# Patient Record
Sex: Female | Born: 1950 | Race: White | Hispanic: No | Marital: Married | Smoking: Current every day smoker
Health system: Southern US, Community
[De-identification: ages and names within clinical notes are randomized; demographics above are authoritative.]

## PROBLEM LIST (undated history)

## (undated) DIAGNOSIS — K219 Gastro-esophageal reflux disease without esophagitis: Secondary | ICD-10-CM

## (undated) DIAGNOSIS — M545 Low back pain, unspecified: Secondary | ICD-10-CM

## (undated) DIAGNOSIS — T4145XA Adverse effect of unspecified anesthetic, initial encounter: Secondary | ICD-10-CM

## (undated) DIAGNOSIS — Z8709 Personal history of other diseases of the respiratory system: Secondary | ICD-10-CM

## (undated) DIAGNOSIS — J189 Pneumonia, unspecified organism: Secondary | ICD-10-CM

## (undated) DIAGNOSIS — T8859XA Other complications of anesthesia, initial encounter: Secondary | ICD-10-CM

## (undated) DIAGNOSIS — R51 Headache: Secondary | ICD-10-CM

## (undated) DIAGNOSIS — R519 Headache, unspecified: Secondary | ICD-10-CM

## (undated) DIAGNOSIS — G8929 Other chronic pain: Secondary | ICD-10-CM

## (undated) DIAGNOSIS — Z8489 Family history of other specified conditions: Secondary | ICD-10-CM

## (undated) DIAGNOSIS — G43909 Migraine, unspecified, not intractable, without status migrainosus: Secondary | ICD-10-CM

## (undated) DIAGNOSIS — M1990 Unspecified osteoarthritis, unspecified site: Secondary | ICD-10-CM

---

## 1969-07-08 HISTORY — PX: WISDOM TOOTH EXTRACTION: SHX21

## 1984-11-07 HISTORY — PX: TUBAL LIGATION: SHX77

## 2012-06-27 HISTORY — PX: FOOT SURGERY: SHX648

## 2012-07-23 ENCOUNTER — Other Ambulatory Visit: Payer: Self-pay | Admitting: Neurological Surgery

## 2012-08-01 ENCOUNTER — Encounter (HOSPITAL_COMMUNITY): Payer: Self-pay | Admitting: Pharmacy Technician

## 2012-08-06 ENCOUNTER — Encounter (HOSPITAL_COMMUNITY): Payer: Self-pay

## 2012-08-06 ENCOUNTER — Encounter (HOSPITAL_COMMUNITY)
Admission: RE | Admit: 2012-08-06 | Discharge: 2012-08-06 | Disposition: A | Discharge status: Home / Self Care | Payer: 59 | Source: Ambulatory Visit | Attending: Neurological Surgery | Admitting: Neurological Surgery

## 2012-08-06 HISTORY — DX: Unspecified osteoarthritis, unspecified site: M19.90

## 2012-08-06 HISTORY — DX: Family history of other specified conditions: Z84.89

## 2012-08-06 HISTORY — DX: Gastro-esophageal reflux disease without esophagitis: K21.9

## 2012-08-06 LAB — BASIC METABOLIC PANEL
BUN: 16 mg/dL (ref 6–23)
Chloride: 105 mEq/L (ref 96–112)
Glucose, Bld: 98 mg/dL (ref 70–99)
Potassium: 4.1 mEq/L (ref 3.5–5.1)

## 2012-08-06 LAB — CBC WITH DIFFERENTIAL/PLATELET
Basophils Relative: 1 % (ref 0–1)
Eosinophils Absolute: 0.2 10*3/uL (ref 0.0–0.7)
Eosinophils Relative: 2 % (ref 0–5)
HCT: 42.8 % (ref 36.0–46.0)
Hemoglobin: 14.9 g/dL (ref 12.0–15.0)
MCH: 32.7 pg (ref 26.0–34.0)
MCHC: 34.8 g/dL (ref 30.0–36.0)
MCV: 93.9 fL (ref 78.0–100.0)
Monocytes Absolute: 0.6 10*3/uL (ref 0.1–1.0)
Monocytes Relative: 5 % (ref 3–12)

## 2012-08-06 LAB — ABO/RH: ABO/RH(D): A POS

## 2012-08-06 NOTE — Pre-Procedure Instructions (Signed)
20 [name] [facility]  08/06/2012   Your procedure is scheduled on:  Wednesday October 9  Report to [facility] at 6:30 AM.  Call this number if you have problems the morning of surgery: [phone]   Remember:   Do not eat or drink:After Midnight.    Take these medicines the morning of surgery with A SIP OF WATER: Tylenol 3, valtrex, protonix if needed.    Do not wear jewelry, make-up or nail polish.  Do not wear lotions, powders, or perfumes. You may wear deodorant.  Do not shave 48 hours prior to surgery. Men may shave face and neck.  Do not bring valuables to the hospital.  Contacts, dentures or bridgework may not be worn into surgery.  Leave suitcase in the car. After surgery it may be brought to your room.  For patients admitted to the hospital, checkout time is 11:00 AM the day of discharge.   Patients discharged the day of surgery will not be allowed to drive home.  Name and phone number of your driver: NA  Special Instructions: Shower using CHG 2 nights before surgery and the night before surgery.  If you shower the day of surgery use CHG.  Use special wash - you have one bottle of CHG for all showers.  You should use approximately 1/3 of the bottle for each shower.   Please read over the following fact sheets that you were given: Pain Booklet, Coughing and Deep Breathing, Blood Transfusion Information and Surgical Site Infection Prevention

## 2012-08-06 NOTE — Progress Notes (Addendum)
Requested CXR from [facility] in [place] - Dr. Emilie Rutter

## 2012-08-14 MED ORDER — DEXAMETHASONE SODIUM PHOSPHATE 10 MG/ML IJ SOLN
10.0000 mg | INTRAMUSCULAR | Status: AC
  Administered 2012-08-15: 10 mg via INTRAVENOUS
  Filled 2012-08-14: qty 1

## 2012-08-14 MED ORDER — VANCOMYCIN HCL 1000 MG IV SOLR
1500.0000 mg | INTRAVENOUS | Status: AC
  Administered 2012-08-15: 1500 mg via INTRAVENOUS
  Filled 2012-08-14: qty 1500

## 2012-08-15 ENCOUNTER — Ambulatory Visit (HOSPITAL_COMMUNITY): Payer: 59

## 2012-08-15 ENCOUNTER — Encounter (HOSPITAL_COMMUNITY)
Admission: RE | Disposition: A | Discharge status: Home / Self Care | Payer: Self-pay | Source: Ambulatory Visit | Attending: Neurological Surgery

## 2012-08-15 ENCOUNTER — Encounter (HOSPITAL_COMMUNITY): Payer: Self-pay | Admitting: Anesthesiology

## 2012-08-15 ENCOUNTER — Inpatient Hospital Stay (HOSPITAL_COMMUNITY)
Admission: RE | Admit: 2012-08-15 | Discharge: 2012-08-16 | DRG: 460 | Disposition: A | Discharge status: Home / Self Care | Payer: 59 | Source: Ambulatory Visit | Attending: Neurological Surgery | Admitting: Neurological Surgery

## 2012-08-15 ENCOUNTER — Encounter (HOSPITAL_COMMUNITY): Payer: Self-pay | Admitting: Neurological Surgery

## 2012-08-15 ENCOUNTER — Ambulatory Visit (HOSPITAL_COMMUNITY): Payer: 59 | Admitting: Anesthesiology

## 2012-08-15 ENCOUNTER — Encounter (HOSPITAL_COMMUNITY): Payer: Self-pay | Admitting: Surgery

## 2012-08-15 DIAGNOSIS — M47817 Spondylosis without myelopathy or radiculopathy, lumbosacral region: Principal | ICD-10-CM | POA: Diagnosis present

## 2012-08-15 DIAGNOSIS — Z882 Allergy status to sulfonamides status: Secondary | ICD-10-CM

## 2012-08-15 DIAGNOSIS — Z7982 Long term (current) use of aspirin: Secondary | ICD-10-CM

## 2012-08-15 DIAGNOSIS — Z981 Arthrodesis status: Secondary | ICD-10-CM

## 2012-08-15 DIAGNOSIS — Z881 Allergy status to other antibiotic agents status: Secondary | ICD-10-CM

## 2012-08-15 DIAGNOSIS — Z880 Allergy status to penicillin: Secondary | ICD-10-CM

## 2012-08-15 DIAGNOSIS — K219 Gastro-esophageal reflux disease without esophagitis: Secondary | ICD-10-CM | POA: Diagnosis present

## 2012-08-15 DIAGNOSIS — F17200 Nicotine dependence, unspecified, uncomplicated: Secondary | ICD-10-CM | POA: Diagnosis present

## 2012-08-15 DIAGNOSIS — Z888 Allergy status to other drugs, medicaments and biological substances status: Secondary | ICD-10-CM

## 2012-08-15 DIAGNOSIS — Q762 Congenital spondylolisthesis: Secondary | ICD-10-CM

## 2012-08-15 HISTORY — DX: Personal history of other diseases of the respiratory system: Z87.09

## 2012-08-15 HISTORY — DX: Other complications of anesthesia, initial encounter: T88.59XA

## 2012-08-15 HISTORY — DX: Headache, unspecified: R51.9

## 2012-08-15 HISTORY — DX: Low back pain: M54.5

## 2012-08-15 HISTORY — DX: Adverse effect of unspecified anesthetic, initial encounter: T41.45XA

## 2012-08-15 HISTORY — DX: Pneumonia, unspecified organism: J18.9

## 2012-08-15 HISTORY — DX: Low back pain, unspecified: M54.50

## 2012-08-15 HISTORY — DX: Migraine, unspecified, not intractable, without status migrainosus: G43.909

## 2012-08-15 HISTORY — DX: Headache: R51

## 2012-08-15 HISTORY — DX: Other chronic pain: G89.29

## 2012-08-15 HISTORY — PX: POSTERIOR FUSION LUMBAR SPINE: SUR632

## 2012-08-15 SURGERY — POSTERIOR LUMBAR FUSION 1 LEVEL
Anesthesia: General | Site: Back | Wound class: Clean

## 2012-08-15 MED ORDER — PHENYLEPHRINE HCL 10 MG/ML IJ SOLN
10.0000 mg | INTRAVENOUS | Status: DC | PRN
  Administered 2012-08-15: 10 ug/min via INTRAVENOUS

## 2012-08-15 MED ORDER — PROPOFOL INFUSION 10 MG/ML OPTIME
INTRAVENOUS | Status: DC | PRN
  Administered 2012-08-15: 50 ug/kg/min via INTRAVENOUS

## 2012-08-15 MED ORDER — PHENYLEPHRINE HCL 10 MG/ML IJ SOLN
INTRAMUSCULAR | Status: DC | PRN
  Administered 2012-08-15: 40 ug via INTRAVENOUS
  Administered 2012-08-15 (×2): 80 ug via INTRAVENOUS

## 2012-08-15 MED ORDER — ACETAMINOPHEN 325 MG PO TABS: 650.0000 mg | ORAL_TABLET | ORAL | Status: DC | PRN

## 2012-08-15 MED ORDER — OXYCODONE-ACETAMINOPHEN 5-325 MG PO TABS
1.0000 | ORAL_TABLET | ORAL | Status: DC | PRN
  Administered 2012-08-15 – 2012-08-16 (×3): 2 via ORAL
  Filled 2012-08-15 (×3): qty 2

## 2012-08-15 MED ORDER — PROMETHAZINE HCL 25 MG/ML IJ SOLN: 6.2500 mg | INTRAMUSCULAR | Status: DC | PRN

## 2012-08-15 MED ORDER — HEPARIN SODIUM (PORCINE) 1000 UNIT/ML IJ SOLN
INTRAMUSCULAR | Status: AC
  Filled 2012-08-15: qty 1

## 2012-08-15 MED ORDER — ADULT MULTIVITAMIN W/MINERALS CH
1.0000 | ORAL_TABLET | Freq: Every day | ORAL | Status: DC
  Administered 2012-08-15: 1 via ORAL
  Filled 2012-08-15 (×2): qty 1

## 2012-08-15 MED ORDER — ONDANSETRON HCL 4 MG/2ML IJ SOLN
INTRAMUSCULAR | Status: DC | PRN
  Administered 2012-08-15: 4 mg via INTRAVENOUS

## 2012-08-15 MED ORDER — ONDANSETRON HCL 4 MG/2ML IJ SOLN: 4.0000 mg | INTRAMUSCULAR | Status: DC | PRN

## 2012-08-15 MED ORDER — MENTHOL 3 MG MT LOZG: 1.0000 | LOZENGE | OROMUCOSAL | Status: DC | PRN

## 2012-08-15 MED ORDER — SODIUM CHLORIDE 0.9 % IV SOLN
INTRAVENOUS | Status: AC
  Filled 2012-08-15: qty 500

## 2012-08-15 MED ORDER — PHENOL 1.4 % MT LIQD
1.0000 | OROMUCOSAL | Status: DC | PRN
  Filled 2012-08-15: qty 177

## 2012-08-15 MED ORDER — ASPIRIN 81 MG PO CHEW
81.0000 mg | CHEWABLE_TABLET | Freq: Every day | ORAL | Status: DC
  Administered 2012-08-15: 81 mg via ORAL
  Filled 2012-08-15 (×2): qty 1

## 2012-08-15 MED ORDER — ACETAMINOPHEN 650 MG RE SUPP: 650.0000 mg | RECTAL | Status: DC | PRN

## 2012-08-15 MED ORDER — 0.9 % SODIUM CHLORIDE (POUR BTL) OPTIME
TOPICAL | Status: DC | PRN
  Administered 2012-08-15: 1000 mL

## 2012-08-15 MED ORDER — HYDROMORPHONE HCL PF 1 MG/ML IJ SOLN
0.2500 mg | INTRAMUSCULAR | Status: DC | PRN
  Administered 2012-08-15 (×4): 0.5 mg via INTRAVENOUS

## 2012-08-15 MED ORDER — VANCOMYCIN HCL IN DEXTROSE 1-5 GM/200ML-% IV SOLN
1000.0000 mg | Freq: Two times a day (BID) | INTRAVENOUS | Status: AC
  Administered 2012-08-15: 1000 mg via INTRAVENOUS
  Filled 2012-08-15: qty 200

## 2012-08-15 MED ORDER — CYCLOBENZAPRINE HCL 10 MG PO TABS: 10.0000 mg | ORAL_TABLET | Freq: Three times a day (TID) | ORAL | Status: DC | PRN

## 2012-08-15 MED ORDER — MEPERIDINE HCL 25 MG/ML IJ SOLN: 6.2500 mg | INTRAMUSCULAR | Status: DC | PRN

## 2012-08-15 MED ORDER — LORATADINE 10 MG PO TABS
10.0000 mg | ORAL_TABLET | Freq: Every day | ORAL | Status: DC
  Filled 2012-08-15 (×2): qty 1

## 2012-08-15 MED ORDER — HEMOSTATIC AGENTS (NO CHARGE) OPTIME
TOPICAL | Status: DC | PRN
  Administered 2012-08-15: 1 via TOPICAL

## 2012-08-15 MED ORDER — ACETAMINOPHEN 10 MG/ML IV SOLN
1000.0000 mg | Freq: Four times a day (QID) | INTRAVENOUS | Status: DC
  Administered 2012-08-15 – 2012-08-16 (×2): 1000 mg via INTRAVENOUS
  Filled 2012-08-15 (×4): qty 100

## 2012-08-15 MED ORDER — MIDAZOLAM HCL 5 MG/5ML IJ SOLN
INTRAMUSCULAR | Status: DC | PRN
  Administered 2012-08-15: 2 mg via INTRAVENOUS

## 2012-08-15 MED ORDER — LIDOCAINE HCL (CARDIAC) 20 MG/ML IV SOLN
INTRAVENOUS | Status: DC | PRN
  Administered 2012-08-15: 50 mg via INTRAVENOUS

## 2012-08-15 MED ORDER — PANTOPRAZOLE SODIUM 40 MG PO TBEC
40.0000 mg | DELAYED_RELEASE_TABLET | Freq: Every day | ORAL | Status: DC
  Administered 2012-08-15: 40 mg via ORAL
  Filled 2012-08-15: qty 1

## 2012-08-15 MED ORDER — SENNA 8.6 MG PO TABS
1.0000 | ORAL_TABLET | Freq: Two times a day (BID) | ORAL | Status: DC
  Filled 2012-08-15 (×3): qty 1

## 2012-08-15 MED ORDER — OXYCODONE HCL 5 MG PO TABS
5.0000 mg | ORAL_TABLET | Freq: Once | ORAL | Status: AC | PRN
  Administered 2012-08-15: 5 mg via ORAL

## 2012-08-15 MED ORDER — SUFENTANIL CITRATE 50 MCG/ML IV SOLN
INTRAVENOUS | Status: DC | PRN
  Administered 2012-08-15: 20 ug via INTRAVENOUS
  Administered 2012-08-15: 10 ug via INTRAVENOUS
  Administered 2012-08-15: 20 ug via INTRAVENOUS

## 2012-08-15 MED ORDER — HYDROMORPHONE HCL PF 1 MG/ML IJ SOLN
INTRAMUSCULAR | Status: AC
  Filled 2012-08-15: qty 1

## 2012-08-15 MED ORDER — PROPOFOL 10 MG/ML IV BOLUS
INTRAVENOUS | Status: DC | PRN
  Administered 2012-08-15: 25 mg via INTRAVENOUS
  Administered 2012-08-15: 150 mg via INTRAVENOUS

## 2012-08-15 MED ORDER — SUCCINYLCHOLINE CHLORIDE 20 MG/ML IJ SOLN
INTRAMUSCULAR | Status: DC | PRN
  Administered 2012-08-15: 100 mg via INTRAVENOUS

## 2012-08-15 MED ORDER — BACITRACIN 50000 UNITS IM SOLR
INTRAMUSCULAR | Status: AC
  Filled 2012-08-15: qty 1

## 2012-08-15 MED ORDER — SODIUM CHLORIDE 0.9 % IJ SOLN
3.0000 mL | Freq: Two times a day (BID) | INTRAMUSCULAR | Status: DC
  Administered 2012-08-15: 3 mL via INTRAVENOUS

## 2012-08-15 MED ORDER — INFLUENZA VIRUS VACC SPLIT PF IM SUSP
0.5000 mL | INTRAMUSCULAR | Status: DC
  Filled 2012-08-15: qty 0.5

## 2012-08-15 MED ORDER — BUPIVACAINE HCL (PF) 0.25 % IJ SOLN
INTRAMUSCULAR | Status: DC | PRN
  Administered 2012-08-15: 3 mL

## 2012-08-15 MED ORDER — OXYCODONE HCL 5 MG PO TABS
ORAL_TABLET | ORAL | Status: AC
  Filled 2012-08-15: qty 1

## 2012-08-15 MED ORDER — SODIUM CHLORIDE 0.9 % IJ SOLN: 3.0000 mL | INTRAMUSCULAR | Status: DC | PRN

## 2012-08-15 MED ORDER — ASPIRIN 81 MG PO TABS: 81.0000 mg | ORAL_TABLET | Freq: Every day | ORAL | Status: DC

## 2012-08-15 MED ORDER — THROMBIN 20000 UNITS EX SOLR
CUTANEOUS | Status: DC | PRN
  Administered 2012-08-15: 08:00:00 via TOPICAL

## 2012-08-15 MED ORDER — ZOLPIDEM TARTRATE 5 MG PO TABS: 5.0000 mg | ORAL_TABLET | Freq: Every evening | ORAL | Status: DC | PRN

## 2012-08-15 MED ORDER — OXYCODONE HCL 5 MG/5ML PO SOLN: 5.0000 mg | Freq: Once | ORAL | Status: AC | PRN

## 2012-08-15 MED ORDER — POTASSIUM CHLORIDE IN NACL 20-0.9 MEQ/L-% IV SOLN
INTRAVENOUS | Status: DC
  Administered 2012-08-15: 17:00:00 via INTRAVENOUS
  Filled 2012-08-15 (×3): qty 1000

## 2012-08-15 MED ORDER — DEXAMETHASONE 4 MG PO TABS
4.0000 mg | ORAL_TABLET | Freq: Four times a day (QID) | ORAL | Status: DC
  Administered 2012-08-15: 4 mg via ORAL
  Filled 2012-08-15 (×7): qty 1

## 2012-08-15 MED ORDER — NICOTINE 7 MG/24HR TD PT24
7.0000 mg | MEDICATED_PATCH | Freq: Every day | TRANSDERMAL | Status: DC
  Administered 2012-08-15: 7 mg via TRANSDERMAL
  Filled 2012-08-15 (×3): qty 1

## 2012-08-15 MED ORDER — DEXAMETHASONE SODIUM PHOSPHATE 4 MG/ML IJ SOLN
4.0000 mg | Freq: Four times a day (QID) | INTRAMUSCULAR | Status: DC
  Administered 2012-08-16 (×2): 4 mg via INTRAVENOUS
  Filled 2012-08-15 (×6): qty 1

## 2012-08-15 MED ORDER — LACTATED RINGERS IV SOLN
INTRAVENOUS | Status: DC | PRN
  Administered 2012-08-15 (×2): via INTRAVENOUS

## 2012-08-15 MED ORDER — MORPHINE SULFATE 2 MG/ML IJ SOLN
1.0000 mg | INTRAMUSCULAR | Status: DC | PRN
  Administered 2012-08-15 (×2): 2 mg via INTRAVENOUS
  Filled 2012-08-15 (×2): qty 1

## 2012-08-15 SURGICAL SUPPLY — 61 items
BAG DECANTER FOR FLEXI CONT (MISCELLANEOUS) ×2 IMPLANT
BENZOIN TINCTURE PRP APPL 2/3 (GAUZE/BANDAGES/DRESSINGS) ×2 IMPLANT
BLADE SURG ROTATE 9660 (MISCELLANEOUS) IMPLANT
BUR MATCHSTICK NEURO 3.0 LAGG (BURR) ×2 IMPLANT
CAGE COROENT 12X9X23-8 (Cage) ×4 IMPLANT
CANISTER SUCTION 2500CC (MISCELLANEOUS) ×2 IMPLANT
CLIP NEUROVISION LG (CLIP) ×2 IMPLANT
CLOTH BEACON ORANGE TIMEOUT ST (SAFETY) ×2 IMPLANT
CONT SPEC 4OZ CLIKSEAL STRL BL (MISCELLANEOUS) ×4 IMPLANT
COVER BACK TABLE 24X17X13 BIG (DRAPES) IMPLANT
COVER TABLE BACK 60X90 (DRAPES) ×2 IMPLANT
DRAPE C-ARM 42X72 X-RAY (DRAPES) ×4 IMPLANT
DRAPE LAPAROTOMY 100X72X124 (DRAPES) ×2 IMPLANT
DRAPE POUCH INSTRU U-SHP 10X18 (DRAPES) ×2 IMPLANT
DRAPE SURG 17X23 STRL (DRAPES) ×2 IMPLANT
DRESSING TELFA 8X3 (GAUZE/BANDAGES/DRESSINGS) ×2 IMPLANT
DRSG OPSITE 4X5.5 SM (GAUZE/BANDAGES/DRESSINGS) ×2 IMPLANT
DURAPREP 26ML APPLICATOR (WOUND CARE) ×2 IMPLANT
ELECT REM PT RETURN 9FT ADLT (ELECTROSURGICAL) ×2
ELECTRODE REM PT RTRN 9FT ADLT (ELECTROSURGICAL) ×1 IMPLANT
EVACUATOR 1/8 PVC DRAIN (DRAIN) ×2 IMPLANT
GAUZE SPONGE 4X4 16PLY XRAY LF (GAUZE/BANDAGES/DRESSINGS) IMPLANT
GLOVE BIO SURGEON STRL SZ8 (GLOVE) ×4 IMPLANT
GLOVE BIOGEL PI IND STRL 6.5 (GLOVE) ×2 IMPLANT
GLOVE BIOGEL PI INDICATOR 6.5 (GLOVE) ×2
GLOVE INDICATOR 7.0 STRL GRN (GLOVE) ×2 IMPLANT
GOWN BRE IMP SLV AUR LG STRL (GOWN DISPOSABLE) IMPLANT
GOWN BRE IMP SLV AUR XL STRL (GOWN DISPOSABLE) ×4 IMPLANT
GOWN STRL REIN 2XL LVL4 (GOWN DISPOSABLE) IMPLANT
HEMOSTAT POWDER KIT SURGIFOAM (HEMOSTASIS) IMPLANT
KIT BASIN OR (CUSTOM PROCEDURE TRAY) ×2 IMPLANT
KIT NEEDLE NVM5 EMG ELECT (KITS) ×1 IMPLANT
KIT NEEDLE NVM5 EMG ELECTRODE (KITS) ×1
KIT ROOM TURNOVER OR (KITS) ×2 IMPLANT
LIGHT SOURCE ANGLE TIP STR 7FT (MISCELLANEOUS) ×2 IMPLANT
MILL MEDIUM DISP (BLADE) IMPLANT
MIX DBX 10CC 35% BONE (Bone Implant) ×2 IMPLANT
NEEDLE HYPO 25X1 1.5 SAFETY (NEEDLE) ×2 IMPLANT
NS IRRIG 1000ML POUR BTL (IV SOLUTION) ×2 IMPLANT
PACK LAMINECTOMY NEURO (CUSTOM PROCEDURE TRAY) ×2 IMPLANT
PAD ARMBOARD 7.5X6 YLW CONV (MISCELLANEOUS) ×6 IMPLANT
ROD 35MM (Rod) ×2 IMPLANT
ROD 5.5X40MM (Rod) ×2 IMPLANT
SCREW LOCK (Screw) ×4 IMPLANT
SCREW LOCK FXNS SPNE MAS PL (Screw) ×4 IMPLANT
SCREW SHANK 5.0X35 (Screw) ×4 IMPLANT
SCREW SHANK 6.5X65 (Screw) ×4 IMPLANT
SCREW TULIP 5.5 (Screw) ×8 IMPLANT
SPONGE LAP 4X18 X RAY DECT (DISPOSABLE) IMPLANT
SPONGE SURGIFOAM ABS GEL 100 (HEMOSTASIS) ×2 IMPLANT
STRIP CLOSURE SKIN 1/2X4 (GAUZE/BANDAGES/DRESSINGS) ×2 IMPLANT
SUT VIC AB 0 CT1 18XCR BRD8 (SUTURE) ×1 IMPLANT
SUT VIC AB 0 CT1 8-18 (SUTURE) ×1
SUT VIC AB 2-0 CP2 18 (SUTURE) ×2 IMPLANT
SUT VIC AB 3-0 SH 8-18 (SUTURE) ×4 IMPLANT
SYR 20ML ECCENTRIC (SYRINGE) ×2 IMPLANT
TOWEL OR 17X24 6PK STRL BLUE (TOWEL DISPOSABLE) ×2 IMPLANT
TOWEL OR 17X26 10 PK STRL BLUE (TOWEL DISPOSABLE) ×2 IMPLANT
TRAP SPECIMEN MUCOUS 40CC (MISCELLANEOUS) IMPLANT
TRAY FOLEY CATH 14FRSI W/METER (CATHETERS) ×2 IMPLANT
WATER STERILE IRR 1000ML POUR (IV SOLUTION) ×2 IMPLANT

## 2012-08-15 NOTE — Transfer of Care (Signed)
Immediate Anesthesia Transfer of Care Note  Patient: Lori Howard  Procedure(s) Performed: Procedure(s) (LRB) with comments: POSTERIOR LUMBAR FUSION 1 LEVEL (N/A) - POSTERIOR LUMBAR FIVE SACRAL ONE FUSION NUVASIVE MONITORING  Patient Location: PACU  Anesthesia Type: General  Level of Consciousness: awake, alert  and oriented  Airway & Oxygen Therapy: Patient Spontanous Breathing and Patient connected to face mask oxygen  Post-op Assessment: Report given to PACU RN, Post -op Vital signs reviewed and stable and Patient moving all extremities  Post vital signs: Reviewed and stable  Complications: No apparent anesthesia complications

## 2012-08-15 NOTE — Progress Notes (Signed)
Pharmacy consult note for vancomycin Indication: surgical prophylaxis post op  Weight - 103kg    Scr 0.77 Patient received 1.5g IV at 0830 this morning.  Plan:  Will give vancomycin 1g x 1 dose this evening  Pharmacy will sign off.  Please reconsult if needed.  Celedonio Miyamoto, PharmD, BCPS Clinical Pharmacist Pager [phone]

## 2012-08-15 NOTE — OR Nursing (Signed)
1213 nUVASIVE MONITORING NEEDLES REMOVED BY AMY WALSH, RN.

## 2012-08-15 NOTE — Anesthesia Procedure Notes (Signed)
Procedure Name: Intubation Date/Time: 08/15/2012 9:13 AM Performed by: Gwenyth Allegra Pre-anesthesia Checklist: Patient identified, Timeout performed, Emergency Drugs available, Suction available and Patient being monitored Patient Re-evaluated:Patient Re-evaluated prior to inductionOxygen Delivery Method: Circle system utilized Preoxygenation: Pre-oxygenation with 100% oxygen Intubation Type: IV induction Ventilation: Mask ventilation without difficulty and Oral airway inserted - appropriate to patient size Laryngoscope Size: Mac and 3 Grade View: Grade II Tube type: Oral Tube size: 7.5 mm Airway Equipment and Method: Stylet Placement Confirmation: ETT inserted through vocal cords under direct vision,  breath sounds checked- equal and bilateral and positive ETCO2 Secured at: 21 cm Tube secured with: Tape Dental Injury: Teeth and Oropharynx as per pre-operative assessment

## 2012-08-15 NOTE — Op Note (Signed)
08/15/2012  12:19 PM  PATIENT:  [name]  61 y.o. female  PRE-OPERATIVE DIAGNOSIS:  Lumbar spondylosis with spondylolisthesis L5-S1 and back and leg pain   POST-OPERATIVE DIAGNOSIS:  Same  PROCEDURE:   1. Decompressive lumbar laminectomy L5-S1 requiring more work than would be required of the typical PLIF procedure in order to adequately decompress the neural elements.  2. Posterior lumbar interbody fusion L5-S1 using a PEEK interbody cage packed with morcellized allograft and autograft.  3. Posterior fixation L5-S1 using NuVasive cortical pedicle screws.  4. posterior lateral arthrodesis L5-S1 utilizing local autograft and morcellized allograft  SURGEON:  Marikay Alar, MD  ASSISTANTS: Dr. Franky Macho  ANESTHESIA:  General  EBL: Less than 200 ml  Total I/O In: 1000 [I.V.:1000] Out: 300 [Urine:100; Blood:200]  BLOOD ADMINISTERED:none  DRAINS: Hemovac   INDICATION FOR PROCEDURE: This patient presented with severe back pain radiating into the right lower extremity. MRI and plain films showed grade 1 spondylolisthesis L5-S1 with foraminal stenosis and severe facet arthropathy. She tried medical management without relief. Recommended a decompression and instrumented fusion L5-S1. Patient understood the risks, benefits, and alternatives and potential outcomes and wished to proceed.  PROCEDURE DETAILS:  The patient was brought to the operating room. After induction of generalized endotracheal anesthesia the patient was rolled into the prone position on chest rolls and all pressure points were padded. The patient's lumbar region was cleaned and then prepped with DuraPrep and draped in the usual sterile fashion. Anesthesia was injected and then a dorsal midline incision was made and carried down to the lumbosacral fascia. The fascia was opened and the paraspinous musculature was taken down in a subperiosteal fashion to expose L5-S1. Intraoperative EMG monitoring was used  throughout the case. Intraoperative fluoroscopy confirmed my level, and I started by placing the L5 cortical pedicle screws. I used AP and lateral fluoroscopy to identify my entry point as well as surface landmarks 2-3 mm medial to the lateral part of the pars. I used the hand drill followed by the tap to prepare the pedicles bilaterally were acceptance of the pedicle screw. I drilled in a slightly upward and outward direction utilizing AP and lateral fluoroscopy and EMG monitoring. The cortical pedicle screws were placed at L5 and then I turned my attention to the PLIF. The spinous process was removed and complete lumbar laminectomies, hemi laminectomies, and foraminotomies were performed at L5-S1. The yellow ligament was removed to expose the underlying dura and nerve roots, and generous foraminotomies were performed to adequately decompress the neural elements. Once the decompression was complete, I turned my attention to the posterior lower lumbar interbody fusion. The epidural venous vasculature was coagulated and cut sharply. Disc space was incised and the initial discectomy was performed with pituitary rongeurs. The disc space was distracted with sequential distractors to a height of 12 mm. We then used a series of scrapers and shavers to prepare the endplates for fusion. The midline was prepared with Epstein curettes. Once the complete discectomy was finished, we packed an appropriate sized 12 mm peek interbody cage with local autograft and morcellized allograft, gently retracted the nerve root, and tapped the cage into position at L5-S1 bilaterally.. The midline was packed with morselized autograft and allograft. We then turned our attention to the posterior fixation once again. The pedicle screw entry zones of S1 were identified utilizing surface landmarks and fluoroscopy. These the hand drill and the tap once again to prepare for a 6 5 x 35 mm cortical pedicle screw. We  palpated with a ball probe to  assure no break in the cortex. We then placed 6 5 x 35 mm screws into the pedicles bilaterally at S1. We then decorticated the transverse processes and laid a mixture of morcellized autograft and allograft out over these to perform intertransverse arthrodesis at 5 S1. We then placed lordotic rods into the multiaxial screw heads of the pedicle screws and locked these in position with the locking caps and anti-torque device. We achieved compression of our grafts. We then checked our construct with AP and lateral fluoroscopy. Irrigated with copious amounts of bacitracin-containing saline solution. Placed a medium Hemovac drain through separate stab incision. Inspected the nerve roots once again to assure adequate decompression, lined to the dura with Gelfoam, and closed the muscle and the fascia with 0 Vicryl. Closed the subcutaneous tissues with 2-0 Vicryl and subcuticular tissues with 3-0 Vicryl. The skin was closed with benzoin and Steri-Strips. Dressing was then applied, the patient was awakened from general anesthesia and transported to the recovery room in stable condition. At the end of the procedure all sponge, needle and instrument counts were correct.   PLAN OF CARE: Admit to inpatient   PATIENT DISPOSITION:  PACU - hemodynamically stable.   Delay start of Pharmacological VTE agent (>24hrs) due to surgical blood loss or risk of bleeding:  yes

## 2012-08-15 NOTE — Anesthesia Postprocedure Evaluation (Signed)
  Anesthesia Post-op Note  Patient: Lori Howard  Procedure(s) Performed: Procedure(s) (LRB) with comments: POSTERIOR LUMBAR FUSION 1 LEVEL (N/A) - POSTERIOR LUMBAR FIVE SACRAL ONE FUSION NUVASIVE MONITORING  Patient Location: PACU  Anesthesia Type: General  Level of Consciousness: awake  Airway and Oxygen Therapy: Patient Spontanous Breathing  Post-op Pain: mild  Post-op Assessment: Post-op Vital signs reviewed  Post-op Vital Signs: stable  Complications: No apparent anesthesia complications

## 2012-08-15 NOTE — Plan of Care (Signed)
Problem: Consults Goal: Diagnosis - Spinal Surgery Outcome: Completed/Met Date Met:  08/15/12 Thoraco/Lumbar Spine Fusion

## 2012-08-15 NOTE — H&P (Signed)
Subjective: Patient is a 61 y.o. female admitted for PLIF L5-S1. Onset of symptoms was several months ago, gradually worsening since that time.  The pain is rated moderate, and is located at the across the lower back and radiates to RLE. The pain is described as aching and occurs intermittently. The symptoms have been progressive. Symptoms are exacerbated by exercise. MRI or CT showed spondylolisthesis L5-s1.   Past Medical History  Diagnosis Date  . Family history of anesthesia complication     mother coded during surgery - in her 50s  . GERD (gastroesophageal reflux disease)   . Arthritis     Past Surgical History  Procedure Date  . Foot surgery 06/27/2012    bilat - toe joint surgery  . Tubal ligation     Prior to Admission medications   Medication Sig Start Date End Date Taking? Authorizing Provider  acetaminophen-codeine (TYLENOL #3) 300-30 MG per tablet Take 1 tablet by mouth every 6 (six) hours as needed. For pain   Yes Historical Provider, MD  Ascorbic Acid (VITAMIN C) 1000 MG tablet Take 1,000 mg by mouth 2 (two) times daily.   Yes Historical Provider, MD  aspirin 81 MG tablet Take 81 mg by mouth daily.   Yes Historical Provider, MD  Calcium Citrate-Vitamin D (CITRUS CALCIUM/VITAMIN D) 200-250 MG-UNIT TABS Take 2 tablets by mouth 2 (two) times daily.   Yes Historical Provider, MD  cyclobenzaprine (FLEXERIL) 10 MG tablet Take 10 mg by mouth 3 (three) times daily as needed. Muscle spasms   Yes Historical Provider, MD  fexofenadine (ALLEGRA) 30 MG tablet Take 30 mg by mouth 2 (two) times daily.   Yes Historical Provider, MD  fish oil-omega-3 fatty acids 1000 MG capsule Take 1 g by mouth 2 (two) times daily.   Yes Historical Provider, MD  Glucosamine-Chondroit-Vit C-Mn (GLUCOSAMINE CHONDR 1500 COMPLX) CAPS Take by mouth.   Yes Historical Provider, MD  Magnesium 100 MG CAPS Take 100 mg by mouth daily.   Yes Historical Provider, MD  Multiple Vitamin (MULTIVITAMIN WITH MINERALS) TABS  Take 1 tablet by mouth daily.   Yes Historical Provider, MD  Omega-3 Fatty Acids (ULTRA OMEGA-3 FISH OIL) 1400 MG CAPS Take 1,400 mg by mouth 2 (two) times daily.   Yes Historical Provider, MD  pantoprazole (PROTONIX) 40 MG tablet Take 40 mg by mouth daily.   Yes Historical Provider, MD  valACYclovir (VALTREX) 1000 MG tablet Take 1,000 mg by mouth 2 (two) times daily as needed. For  Flare ups    Historical Provider, MD   Allergies  Allergen Reactions  . Lamisil (Terbinafine Hcl) Other (See Comments)    Wheezing & shortness of breath  . Lyrica (Pregabalin) Other (See Comments)    Tunnel vision   . Neosporin (Neomycin-Bacitracin Zn-Polymyx) Other (See Comments)    Swelling, redness, blisters  . Valtrex (Valacyclovir Hcl) Swelling    Facial swelling  . Fluconazole Rash  . Penicillins Itching and Rash  . Sulfa Antibiotics Itching and Rash    History  Substance Use Topics  . Smoking status: Current Every Day Smoker -- 0.5 packs/day  . Smokeless tobacco: Not on file  . Alcohol Use: No    History reviewed. No pertinent family history.   Review of Systems  Positive ROS: neg  All other systems have been reviewed and were otherwise negative with the exception of those mentioned in the HPI and as above.  Objective: Vital signs in last 24 hours: Temp:  [98 F (36.7 C)] 98  F (36.7 C) (10/09 0648) Pulse Rate:  [84] 84  (10/09 0648) Resp:  [17] 17  (10/09 0648) BP: (108)/(73) 108/73 mmHg (10/09 0648) SpO2:  [98 %] 98 % (10/09 0648)  General Appearance: Alert, cooperative, no distress, appears stated age Head: Normocephalic, without obvious abnormality, atraumatic Eyes: PERRL, conjunctiva/corneas clear, EOM's intact, fundi benign, both eyes      Ears: Normal TM's and external ear canals, both ears Throat: Lips, mucosa, and tongue normal; teeth and gums normal Neck: Supple, symmetrical, trachea midline, no adenopathy; thyroid: No enlargement/tenderness/nodules; no carotid bruit or  JVD Back: Symmetric, no curvature, ROM normal, no CVA tenderness Lungs: Clear to auscultation bilaterally, respirations unlabored Heart: Regular rate and rhythm, S1 and S2 normal, no murmur, rub or gallop Abdomen: Soft, non-tender, bowel sounds active all four quadrants, no masses, no organomegaly Extremities: Extremities normal, atraumatic, no cyanosis or edema Pulses: 2+ and symmetric all extremities Skin: Skin color, texture, turgor normal, no rashes or lesions  NEUROLOGIC:   Mental status: Alert and oriented x4,  no aphasia, good attention span, fund of knowledge, and memory Motor Exam - grossly normal Sensory Exam - grossly normal Reflexes: 1+ Coordination - grossly normal Gait - grossly normal Balance - grossly normal Cranial Nerves: I: smell Not tested  II: visual acuity  OS: nl    OD: nl  II: visual fields Full to confrontation  II: pupils Equal, round, reactive to light  III,VII: ptosis None  III,IV,VI: extraocular muscles  Full ROM  V: mastication Normal  V: facial light touch sensation  Normal  V,VII: corneal reflex  Present  VII: facial muscle function - upper  Normal  VII: facial muscle function - lower Normal  VIII: hearing Not tested  IX: soft palate elevation  Normal  IX,X: gag reflex Present  XI: trapezius strength  5/5  XI: sternocleidomastoid strength 5/5  XI: neck flexion strength  5/5  XII: tongue strength  Normal    Data Review Lab Results  Component Value Date   WBC 11.0* 08/06/2012   HGB 14.9 08/06/2012   HCT 42.8 08/06/2012   MCV 93.9 08/06/2012   PLT 208 08/06/2012   Lab Results  Component Value Date   NA 141 08/06/2012   K 4.1 08/06/2012   CL 105 08/06/2012   CO2 25 08/06/2012   BUN 16 08/06/2012   CREATININE 0.77 08/06/2012   GLUCOSE 98 08/06/2012   Lab Results  Component Value Date   INR 0.92 08/06/2012    Assessment/Plan: Patient admitted for PLIF. Patient has failed conservative therapy.  I explained the condition and procedure to  the patient and answered any questions.  Patient wishes to proceed with procedure as planned. Understands risks/ benefits and typical outcomes of procedure.   Lori Howard S 08/15/2012 8:29 AM

## 2012-08-15 NOTE — Progress Notes (Signed)
Orthopedic Tech Progress Note Patient Details:  Lori Howard May 24, 1951 [id number]  Patient ID: [name], female   DOB: [birth year], 60 y.o.   MRN: [id number]   [name] 08/15/2012, 8:41 AM LS SUPPORT WITH ANT. AND POST. PANELS WAS A PRE-FIT IN [facility] OFFICE.

## 2012-08-15 NOTE — Preoperative (Signed)
Beta Blockers   Reason not to administer Beta Blockers:Not Applicable 

## 2012-08-15 NOTE — OR Nursing (Signed)
NUVASIVE NEEDLE ELECTRODES PLACED PRIOR TO POSITIONING BY Faithe Ariola RN AND AMY [facility] RN

## 2012-08-15 NOTE — Anesthesia Preprocedure Evaluation (Signed)
Anesthesia Evaluation  Patient identified by MRN, date of birth, ID band Patient awake    Reviewed: Allergy & Precautions, Patient's Chart, lab work & pertinent test results  History of Anesthesia Complications Negative for: history of anesthetic complications  Airway Mallampati: I  Neck ROM: Full    Dental  (+) Teeth Intact   Pulmonary neg pulmonary ROS,  breath sounds clear to auscultation        Cardiovascular negative cardio ROS  Rhythm:Regular Rate:Normal     Neuro/Psych negative neurological ROS     GI/Hepatic Neg liver ROS, GERD-  ,  Endo/Other  negative endocrine ROS  Renal/GU negative Renal ROS     Musculoskeletal   Abdominal   Peds  Hematology   Anesthesia Other Findings   Reproductive/Obstetrics                           Anesthesia Physical Anesthesia Plan  ASA: I  Anesthesia Plan: General   Post-op Pain Management:    Induction: Intravenous  Airway Management Planned: Oral ETT  Additional Equipment:   Intra-op Plan:   Post-operative Plan: Extubation in OR  Informed Consent: I have reviewed the patients History and Physical, chart, labs and discussed the procedure including the risks, benefits and alternatives for the proposed anesthesia with the patient or authorized representative who has indicated his/her understanding and acceptance.     Plan Discussed with: CRNA and Surgeon  Anesthesia Plan Comments:         Anesthesia Quick Evaluation

## 2012-08-16 ENCOUNTER — Encounter (HOSPITAL_COMMUNITY): Payer: Self-pay | Admitting: General Practice

## 2012-08-16 MED ORDER — CYCLOBENZAPRINE HCL 10 MG PO TABS: 10.0000 mg | ORAL_TABLET | Freq: Three times a day (TID) | ORAL | Status: DC | PRN

## 2012-08-16 MED ORDER — OXYCODONE-ACETAMINOPHEN 5-325 MG PO TABS: 1.0000 | ORAL_TABLET | Freq: Four times a day (QID) | ORAL | Status: AC | PRN

## 2012-08-16 NOTE — Discharge Summary (Signed)
Physician Discharge Summary  Patient ID: Lori Howard MRN: [id number] DOB/AGE: [birth year] 61 y.o.  Admit date: 08/15/2012 Discharge date: 08/16/2012  Admission Diagnoses: Lumbar spondylosis and spondylolisthesis    Discharge Diagnoses: Same   Discharged Condition: good  Hospital Course: The patient was admitted on 08/15/2012 and taken to the operating room where the patient underwent PLIF L5 S1. The patient tolerated the procedure well and was taken to the recovery room and then to the floor in stable condition. The hospital course was routine. There were no complications. The wound remained clean dry and intact. Pt had appropriate back soreness though it was fairly minimal. No complaints of leg pain or new N/T/W. The patient remained afebrile with stable vital signs, and tolerated a regular diet. The patient continued to increase activities, and pain was well controlled with oral pain medications.   Consults: None  Significant Diagnostic Studies:  Results for orders placed during the hospital encounter of 08/06/12  SURGICAL PCR SCREEN      Component Value Range   MRSA, PCR NEGATIVE  NEGATIVE   Staphylococcus aureus NEGATIVE  NEGATIVE  TYPE AND SCREEN      Component Value Range   ABO/RH(D) A POS     Antibody Screen NEG     Sample Expiration 08/20/2012    BASIC METABOLIC PANEL      Component Value Range   Sodium 141  135 - 145 mEq/L   Potassium 4.1  3.5 - 5.1 mEq/L   Chloride 105  96 - 112 mEq/L   CO2 25  19 - 32 mEq/L   Glucose, Bld 98  70 - 99 mg/dL   BUN 16  6 - 23 mg/dL   Creatinine, Ser 3.29  0.50 - 1.10 mg/dL   Calcium 51.8 (*) 8.4 - 10.5 mg/dL   GFR calc non Af Amer 88 (*) >90 mL/min   GFR calc Af Amer >90  >90 mL/min  CBC WITH DIFFERENTIAL      Component Value Range   WBC 11.0 (*) 4.0 - 10.5 K/uL   RBC 4.56  3.87 - 5.11 MIL/uL   Hemoglobin 14.9  12.0 - 15.0 g/dL   HCT 84.1  66.0 - 63.0 %   MCV 93.9  78.0 - 100.0 fL   MCH 32.7  26.0 - 34.0 pg   MCHC 34.8  30.0 - 36.0 g/dL   RDW 16.0  10.9 - 32.3 %   Platelets 208  150 - 400 K/uL   Neutrophils Relative 59  43 - 77 %   Neutro Abs 6.5  1.7 - 7.7 K/uL   Lymphocytes Relative 34  12 - 46 %   Lymphs Abs 3.7  0.7 - 4.0 K/uL   Monocytes Relative 5  3 - 12 %   Monocytes Absolute 0.6  0.1 - 1.0 K/uL   Eosinophils Relative 2  0 - 5 %   Eosinophils Absolute 0.2  0.0 - 0.7 K/uL   Basophils Relative 1  0 - 1 %   Basophils Absolute 0.1  0.0 - 0.1 K/uL  PROTIME-INR      Component Value Range   Prothrombin Time 12.3  11.6 - 15.2 seconds   INR 0.92  0.00 - 1.49  ABO/RH      Component Value Range   ABO/RH(D) A POS      Dg Lumbar Spine 2-3 Views  08/15/2012  *RADIOLOGY REPORT*  Clinical Data: Low back pain  DG C-ARM 61-120 MIN,LUMBAR SPINE - 2-3 VIEW  Comparison: Plain films  07/23/2012  Findings: Lateral C-arm view and AP C-arm view demonstrates L5-S1 PLIF.  Satisfactory position and alignment.  IMPRESSION: As above.   Original Report Authenticated By: Elsie Stain, M.D.    Dg C-arm 61-120 Min  08/15/2012  *RADIOLOGY REPORT*  Clinical Data: Low back pain  DG C-ARM 61-120 MIN,LUMBAR SPINE - 2-3 VIEW  Comparison: Plain films 07/23/2012  Findings: Lateral C-arm view and AP C-arm view demonstrates L5-S1 PLIF.  Satisfactory position and alignment.  IMPRESSION: As above.   Original Report Authenticated By: Elsie Stain, M.D.     Antibiotics:  Anti-infectives     Start     Dose/Rate Route Frequency Ordered Stop   08/15/12 2100   vancomycin (VANCOCIN) IVPB 1000 mg/200 mL premix        1,000 mg 200 mL/hr over 60 Minutes Intravenous Every 12 hours 08/15/12 1630 08/15/12 2258   08/15/12 0806   bacitracin 46962 UNITS injection     Comments: RATCLIFF, ESTHER: cabinet override         08/15/12 0806 08/15/12 2014   08/15/12 0500   vancomycin (VANCOCIN) 1,500 mg in sodium chloride 0.9 % 500 mL IVPB        1,500 mg 250 mL/hr over 120 Minutes Intravenous 60 min pre-op 08/14/12 1441 08/15/12 0830           Discharge Exam: Blood pressure 133/68, pulse 91, temperature 97.7 F (36.5 C), temperature source Oral, resp. rate 18, SpO2 95.00%. Neurologic: Grossly normal with good lower extremity strength Incision clean gone intact  Discharge Medications:     Medication List     As of 08/16/2012  8:00 AM    TAKE these medications         acetaminophen-codeine 300-30 MG per tablet   Commonly known as: TYLENOL #3   Take 1 tablet by mouth every 6 (six) hours as needed. For pain      aspirin 81 MG tablet   Take 81 mg by mouth daily.      Citrus Calcium/Vitamin D 200-250 MG-UNIT Tabs   Take 2 tablets by mouth 2 (two) times daily.      cyclobenzaprine 10 MG tablet   Commonly known as: FLEXERIL   Take 1 tablet (10 mg total) by mouth 3 (three) times daily as needed for muscle spasms. Muscle spasms      fexofenadine 30 MG tablet   Commonly known as: ALLEGRA   Take 30 mg by mouth 2 (two) times daily.      fish oil-omega-3 fatty acids 1000 MG capsule   Take 1 g by mouth 2 (two) times daily.      ULTRA OMEGA-3 FISH OIL 1400 MG Caps   Take 1,400 mg by mouth 2 (two) times daily.      Glucosamine Chondr 1500 Complx Caps   Take by mouth.      Magnesium 100 MG Caps   Take 100 mg by mouth daily.      multivitamin with minerals Tabs   Take 1 tablet by mouth daily.      oxyCODONE-acetaminophen 5-325 MG per tablet   Commonly known as: PERCOCET/ROXICET   Take 1-2 tablets by mouth every 6 (six) hours as needed for pain.      pantoprazole 40 MG tablet   Commonly known as: PROTONIX   Take 40 mg by mouth daily.      valACYclovir 1000 MG tablet   Commonly known as: VALTREX   Take 1,000 mg by mouth 2 (two) times daily  as needed. For  Flare ups      vitamin C 1000 MG tablet   Take 1,000 mg by mouth 2 (two) times daily.        Disposition: Home  Final Dx: PLIF     Discharge Orders    Future Orders Please Complete By Expires   Diet - low sodium heart healthy      Increase  activity slowly      Discharge instructions      Comments:   No twisting bending or heavy lifting   Driving Restrictions      Comments:   Until after your first followup doctor's visit   Lifting restrictions      Comments:   Less than a gallon of milk   Call MD for:  temperature >100.4      Call MD for:  persistant nausea and vomiting      Call MD for:  severe uncontrolled pain      Call MD for:  redness, tenderness, or signs of infection (pain, swelling, redness, odor or green/yellow discharge around incision site)      Call MD for:  difficulty breathing, headache or visual disturbances         Follow-up Information    Follow up with Harkirat Orozco S, MD. In 2 weeks.   Contact information:   [address] [place] [address] [phone]           Signed: Tia Alert 08/16/2012, 8:00 AM

## 2012-08-16 NOTE — Evaluation (Signed)
Occupational Therapy Evaluation and Discharge Patient Details Name: Lori Howard MRN: [id number] DOB: Aug 01, 1951 Today's Date: 08/16/2012 Time: 4098-1191 OT Time Calculation (min): 11 min  OT Assessment / Plan / Recommendation Clinical Impression  This 61 yo female s/p back fusion presents to acute OT with all education completed. Will D/C from acute OT.    OT Assessment  Patient does not need any further OT services    Follow Up Recommendations  No OT follow up       Equipment Recommendations  None recommended by OT          Precautions / Restrictions Precautions Precautions: Back;Fall Precaution Booklet Issued: Yes (comment) Required Braces or Orthoses: Spinal Brace Spinal Brace: Lumbar corset;Applied in sitting position Restrictions Weight Bearing Restrictions: No       ADL  Eating/Feeding: Simulated;Independent Where Assessed - Eating/Feeding: Chair Grooming: Simulated;Supervision/safety Where Assessed - Grooming: Unsupported standing Upper Body Bathing: Simulated;Supervision/safety Where Assessed - Upper Body Bathing: Unsupported sit to stand Lower Body Bathing: Simulated;Moderate assistance (needs A for lower legs and feet) Where Assessed - Lower Body Bathing: Unsupported sit to stand Upper Body Dressing: Simulated;Supervision/safety Where Assessed - Upper Body Dressing: Unsupported sitting Lower Body Dressing: Simulated (needs A for socks and non slip on shoes) Where Assessed - Lower Body Dressing: Unsupported sit to stand Toilet Transfer: Simulated;Modified independent Toilet Transfer Method: Sit to stand Toileting - Clothing Manipulation and Hygiene: Simulated;Modified independent Where Assessed - Toileting Clothing Manipulation and Hygiene: Standing Tub/Shower Transfer:  (Went over how to side step demo in room, pt v. understanding) Equipment Used: Back brace ADL Comments: Pt's husband is there to A prn. Mentioned AE to them and they are not  interested at this time.         Visit Information  Last OT Received On: 08/16/12 Assistance Needed: +1    Subjective Data  Subjective: I was doing that before (side stepping into the tub) Patient Stated Goal: Going home today   Prior Functioning     Home Living Lives With: Son;Spouse Available Help at Discharge: Family;Available 24 hours/day Type of Home: House Home Access: Stairs to enter [employer] of Steps: 2 Entrance Stairs-Rails: None Home Layout: One level Bathroom Shower/Tub: Forensic scientist: Standard Bathroom Accessibility: Yes How Accessible: Accessible via walker Home Adaptive Equipment: Straight cane;Grab bars in shower;Hand-held shower hose (I think I have a tub seat) Prior Function Level of Independence: Independent Able to Take Stairs?: Yes Driving: Yes Vocation: Retired Comments: Limited activity tolerance because of pain Communication Communication: No difficulties Dominant Hand: Right            Cognition  Overall Cognitive Status: Appears within functional limits for tasks assessed/performed Arousal/Alertness: Awake/alert Orientation Level: Appears intact for tasks assessed Behavior During Session: [facility] for tasks performed Cognition - Other Comments: slightly impulsive    Extremity/Trunk Assessment Right Upper Extremity Assessment RUE ROM/Strength/Tone: Within functional levels Left Upper Extremity Assessment LUE ROM/Strength/Tone: Within functional levels Right Lower Extremity Assessment RLE ROM/Strength/Tone: WFL for tasks assessed Left Lower Extremity Assessment LLE ROM/Strength/Tone: WFL for tasks assessed Trunk Assessment Trunk Assessment: Other exceptions Trunk Exceptions: increased lordosis     Mobility Bed Mobility Bed Mobility: Not assessed Details for Bed Mobility Assistance: Pt able to verbalize correct technique for log roll following education and handout was provided   Transfers Sit to Stand: 6: Modified independent (Device/Increase time);With upper extremity assist Stand to Sit: 6: Modified independent (Device/Increase time);With upper extremity assist  End of Session OT - End of Session Equipment Utilized During Treatment: Back brace Activity Tolerance: Patient tolerated treatment well Patient left: in chair;with call bell/phone within reach;with family/visitor present (husband)       Evette Georges [phone] 08/16/2012, 11:31 AM

## 2012-08-16 NOTE — Evaluation (Signed)
Physical Therapy Evaluation and Discharge Patient Details Name: Lori Howard MRN: [id number] DOB: 21-Mar-1951 Today's Date: 08/16/2012 Time: 4098-1191 PT Time Calculation (min): 18 min  PT Assessment / Plan / Recommendation Clinical Impression  Lori Howard is 61 y/o female who underwent lumbar fusion POD #1. Plans to d/c home this morning. Physical therapy session focused on pt education concerning back precautions and safe movement. Pt reports she will have 24 hour assist at home from son and husband. Educated her on my recommendation for OPPT for f/u therapies once cleared by Dr. Yetta Barre and she is agreeable. No further acute PT needs at this time.     PT Assessment  All further PT needs can be met in the next venue of care    Follow Up Recommendations  Outpatient PT (when cleared by surgeon)    Does the patient have the potential to tolerate intense rehabilitation      Barriers to Discharge        Equipment Recommendations  None recommended by PT    Recommendations for Other Services     Frequency      Precautions / Restrictions Precautions Precautions: Back;Fall Precaution Booklet Issued: Yes (comment) Required Braces or Orthoses: Spinal Brace Spinal Brace: Lumbar corset;Applied in sitting position   Pertinent Vitals/Pain 2/10 back pain      Mobility  Bed Mobility Bed Mobility: Not assessed Details for Bed Mobility Assistance: Pt able to verbalize correct technique for log roll following education and handout was provided  Transfers Transfers: Sit to Stand;Stand to Sit Sit to Stand: 6: Modified independent (Device/Increase time);With upper extremity assist Stand to Sit: 6: Modified independent (Device/Increase time);With upper extremity assist Ambulation/Gait Ambulation/Gait Assistance: 5: Supervision Ambulation Distance (Feet): 200 Feet Assistive device: Straight cane Ambulation/Gait Assistance Details: supervision 2/2 pt's slight instability  with gait (antalgia and fatigue) without AD; with SPC pt needing minimal v/c's for technique, SPC improved pt's stability; Educated pt on slow progression of activity following surgery to build up her activity tolerance as pt demonstrating SOB as well as break down of correct posture and form after only 50 ft of ambulation, pt verbalized understanding, pt also educated on having someone with her for her frequent walks daily Gait Pattern: Antalgic;Wide base of support Stairs: Yes Stairs Assistance: 5: Supervision Stairs Assistance Details (indicate cue type and reason): cues for safe speed and technique, husband will be with patient to provide support as her steps at home have no railings Stair Management Technique: Two rails;Forwards Number of Stairs: 3  Wheelchair Mobility Wheelchair Mobility: No              PT Diagnosis: Abnormality of gait;Generalized weakness  PT Problem List: Decreased activity tolerance;Decreased balance;Pain PT Treatment Interventions:     PT Goals    Visit Information  Last PT Received On: 08/16/12 Assistance Needed: +1    Subjective Data  Subjective: I am bugging out of this place this morning!  Patient Stated Goal: home   Prior Functioning  Home Living Lives With: Son;Spouse Available Help at Discharge: Family;Available 24 hours/day Type of Home: House Home Access: Stairs to enter [employer] of Steps: 2 Entrance Stairs-Rails: None Home Layout: One level Bathroom Shower/Tub: Engineer, manufacturing systems: Standard Home Adaptive Equipment: Straight cane;Grab bars in shower Prior Function Level of Independence: Independent Able to Take Stairs?: Yes Driving: Yes Vocation: Retired Comments: Limited activity tolerance because of pain Communication Communication: No difficulties    Cognition  Overall Cognitive Status: Appears within functional limits for  tasks assessed/performed Arousal/Alertness: Awake/alert Orientation Level:  Appears intact for tasks assessed Behavior During Session: [facility] for tasks performed Cognition - Other Comments: slightly impulsive    Extremity/Trunk Assessment Right Upper Extremity Assessment RUE ROM/Strength/Tone: Within functional levels Left Upper Extremity Assessment LUE ROM/Strength/Tone: Within functional levels Right Lower Extremity Assessment RLE ROM/Strength/Tone: WFL for tasks assessed Left Lower Extremity Assessment LLE ROM/Strength/Tone: WFL for tasks assessed Trunk Assessment Trunk Assessment: Other exceptions Trunk Exceptions: increased lordosis   Balance    End of Session PT - End of Session Equipment Utilized During Treatment: Gait belt;Back brace Activity Tolerance: Patient tolerated treatment well;Patient limited by fatigue Patient left: in chair;with call bell/phone within reach Nurse Communication: Mobility status  GP     [facility] Lori Howard 08/16/2012, 8:37 AM

## 2012-08-20 MED FILL — Heparin Sodium (Porcine) Inj 1000 Unit/ML: INTRAMUSCULAR | Qty: 30 | Status: AC

## 2012-08-20 MED FILL — Sodium Chloride IV Soln 0.9%: INTRAVENOUS | Qty: 1000 | Status: AC

## 2012-10-08 ENCOUNTER — Encounter (HOSPITAL_BASED_OUTPATIENT_CLINIC_OR_DEPARTMENT_OTHER): Payer: 59

## 2012-11-12 ENCOUNTER — Encounter (HOSPITAL_BASED_OUTPATIENT_CLINIC_OR_DEPARTMENT_OTHER): Payer: 59

## 2014-06-24 ENCOUNTER — Other Ambulatory Visit: Payer: Self-pay | Admitting: Neurological Surgery

## 2014-06-30 ENCOUNTER — Encounter (HOSPITAL_COMMUNITY): Payer: Self-pay | Admitting: Pharmacy Technician

## 2014-07-03 NOTE — Pre-Procedure Instructions (Signed)
Lori Howard  07/03/2014   Your procedure is scheduled on:  Friday, September 4th  Report to [facility] Admitting at 530 AM.  Call this number if you have problems the morning of surgery: [phone]   Remember:   Do not eat food or drink liquids after midnight.   Take these medicines the morning of surgery with A SIP OF WATER: protonix, claritin, percocet if needed  Stop taking aspirin, OTC vitamins/herbal medications, NSAIDS (ibuprofen, advil, motrin, mobic) 7 days prior to surgery.   Do not wear jewelry, make-up or nail polish.  Do not wear lotions, powders, or perfumes. You may wear deodorant.  Do not shave 48 hours prior to surgery. Men may shave face and neck.  Do not bring valuables to the hospital.  [facility] is not responsible  for any belongings or valuables.               Contacts, dentures or bridgework may not be worn into surgery.  Leave suitcase in the car. After surgery it may be brought to your room.  For patients admitted to the hospital, discharge time is determined by your treatment team.              Please read over the following fact sheets that you were given: Pain Booklet, Coughing and Deep Breathing, Blood Transfusion Information, MRSA Information and Surgical Site Infection Prevention [place] - Preparing for Surgery  Before surgery, you can play an important role.  Because skin is not sterile, your skin needs to be as free of germs as possible.  You can reduce the number of germs on you skin by washing with CHG (chlorahexidine gluconate) soap before surgery.  CHG is an antiseptic cleaner which kills germs and bonds with the skin to continue killing germs even after washing.  Please DO NOT use if you have an allergy to CHG or antibacterial soaps.  If your skin becomes reddened/irritated stop using the CHG and inform your nurse when you arrive at Short Stay.  Do not shave (including legs and underarms) for at least 48 hours prior to  the first CHG shower.  You may shave your face.  Please follow these instructions carefully:   1.  Shower with CHG Soap the night before surgery and the morning of Surgery.  2.  If you choose to wash your hair, wash your hair first as usual with your normal shampoo.  3.  After you shampoo, rinse your hair and body thoroughly to remove the shampoo.  4.  Use CHG as you would any other liquid soap.  You can apply CHG directly to the skin and wash gently with scrungie or a clean washcloth.  5.  Apply the CHG Soap to your body ONLY FROM THE NECK DOWN.  Do not use on open wounds or open sores.  Avoid contact with your eyes, ears, mouth and genitals (private parts).  Wash genitals (private parts) with your normal soap.  6.  Wash thoroughly, paying special attention to the area where your surgery will be performed.  7.  Thoroughly rinse your body with warm water from the neck down.  8.  DO NOT shower/wash with your normal soap after using and rinsing off the CHG Soap.  9.  Pat yourself dry with a clean towel.            10.  Wear clean pajamas.            11.  Place clean  sheets on your bed the night of your first shower and do not sleep with pets.  Day of Surgery  Do not apply any lotions/deoderants the morning of surgery.  Please wear clean clothes to the hospital/surgery center.

## 2014-07-04 ENCOUNTER — Encounter (HOSPITAL_COMMUNITY): Payer: Self-pay

## 2014-07-04 ENCOUNTER — Encounter (HOSPITAL_COMMUNITY)
Admission: RE | Admit: 2014-07-04 | Discharge: 2014-07-04 | Disposition: A | Discharge status: Home / Self Care | Payer: 59 | Source: Ambulatory Visit | Attending: Neurological Surgery | Admitting: Neurological Surgery

## 2014-07-04 DIAGNOSIS — T84498A Other mechanical complication of other internal orthopedic devices, implants and grafts, initial encounter: Secondary | ICD-10-CM | POA: Insufficient documentation

## 2014-07-04 DIAGNOSIS — Z01818 Encounter for other preprocedural examination: Secondary | ICD-10-CM | POA: Insufficient documentation

## 2014-07-04 LAB — CBC WITH DIFFERENTIAL/PLATELET
Basophils Absolute: 0.1 10*3/uL (ref 0.0–0.1)
Basophils Relative: 1 % (ref 0–1)
EOS PCT: 3 % (ref 0–5)
Eosinophils Absolute: 0.3 10*3/uL (ref 0.0–0.7)
HCT: 43.9 % (ref 36.0–46.0)
HEMOGLOBIN: 15 g/dL (ref 12.0–15.0)
Lymphocytes Relative: 27 % (ref 12–46)
Lymphs Abs: 2.6 10*3/uL (ref 0.7–4.0)
MCH: 33.9 pg (ref 26.0–34.0)
MCHC: 34.2 g/dL (ref 30.0–36.0)
MCV: 99.3 fL (ref 78.0–100.0)
Monocytes Absolute: 0.7 10*3/uL (ref 0.1–1.0)
Monocytes Relative: 7 % (ref 3–12)
Neutro Abs: 6 10*3/uL (ref 1.7–7.7)
Neutrophils Relative %: 62 % (ref 43–77)
Platelets: 208 10*3/uL (ref 150–400)
RBC: 4.42 MIL/uL (ref 3.87–5.11)
RDW: 13.7 % (ref 11.5–15.5)
WBC: 9.7 10*3/uL (ref 4.0–10.5)

## 2014-07-04 LAB — BASIC METABOLIC PANEL
Anion gap: 16 — ABNORMAL HIGH (ref 5–15)
BUN: 23 mg/dL (ref 6–23)
CHLORIDE: 103 meq/L (ref 96–112)
CO2: 23 mEq/L (ref 19–32)
Calcium: 9.9 mg/dL (ref 8.4–10.5)
Creatinine, Ser: 1.17 mg/dL — ABNORMAL HIGH (ref 0.50–1.10)
GFR calc Af Amer: 56 mL/min — ABNORMAL LOW (ref >90)
GFR, EST NON AFRICAN AMERICAN: 48 mL/min — AB (ref >90)
GLUCOSE: 104 mg/dL — AB (ref 70–99)
Potassium: 4.9 mEq/L (ref 3.7–5.3)
Sodium: 142 mEq/L (ref 137–147)

## 2014-07-04 LAB — TYPE AND SCREEN
ABO/RH(D): A POS
Antibody Screen: NEGATIVE

## 2014-07-04 LAB — PROTIME-INR
INR: 0.97 (ref 0.00–1.49)
Prothrombin Time: 12.9 seconds (ref 11.6–15.2)

## 2014-07-04 LAB — SURGICAL PCR SCREEN
MRSA, PCR: NEGATIVE
Staphylococcus aureus: NEGATIVE

## 2014-07-04 NOTE — Progress Notes (Signed)
Primary - [facility] Dr.sider No prior cardiac testing or cardiology

## 2014-07-07 NOTE — Progress Notes (Signed)
Anesthesia Chart Review:  Pt is 63 year old female posted for removal of hardware and replacement of hardware/L5-S1 posterior lateral fusion on 07/11/14 by Dr. Yetta Barre.   PMH: GERD, arthritis, migraines. Pt is 23.5 pack year smoker.   Medications include: meloxicam, protonix, ASA.   Chest x-ray reviewed. No acute cardiopulmonary findings.  Preoperative labs reviewed.  Creatinine is 1.17, up from 0.77 a year ago.   EKG: NSR, T wave inversion, consider anterior leads ischemia. Confirmed by Dr. Herbie Baltimore, who notes no significant change from previous tracing 08/06/14.   Previous EKG was obtained in PAT for lumbar fusion surgery in 2013. As pt tolerated previous surgery which is similar to the surgery currently scheduled, and as EKG hasn't changed, pt will likely tolerate surgery.  If no CV complaints in PAT, and no changes in sx prior to surgery, I anticipate pt can proceed as scheduled.   Rica Mast, FNP-BC [facility]/Anesthesiology Phone: [phone] 07/07/2014 1:35 PM

## 2014-07-10 MED ORDER — SODIUM CHLORIDE 0.9 % IV SOLN
1500.0000 mg | INTRAVENOUS | Status: AC
  Administered 2014-07-11: 1500 mg via INTRAVENOUS
  Filled 2014-07-10 (×2): qty 1500

## 2014-07-11 ENCOUNTER — Inpatient Hospital Stay (HOSPITAL_COMMUNITY)
Admission: RE | Admit: 2014-07-11 | Discharge: 2014-07-12 | DRG: 460 | Disposition: A | Discharge status: Home / Self Care | Payer: 59 | Source: Ambulatory Visit | Attending: Neurological Surgery | Admitting: Neurological Surgery

## 2014-07-11 ENCOUNTER — Encounter (HOSPITAL_COMMUNITY): Payer: Self-pay | Admitting: *Deleted

## 2014-07-11 ENCOUNTER — Inpatient Hospital Stay (HOSPITAL_COMMUNITY): Payer: 59 | Admitting: Anesthesiology

## 2014-07-11 ENCOUNTER — Encounter (HOSPITAL_COMMUNITY): Payer: 59 | Admitting: Emergency Medicine

## 2014-07-11 ENCOUNTER — Encounter (HOSPITAL_COMMUNITY)
Admission: RE | Disposition: A | Discharge status: Home / Self Care | Payer: Self-pay | Source: Ambulatory Visit | Attending: Neurological Surgery

## 2014-07-11 DIAGNOSIS — T84498A Other mechanical complication of other internal orthopedic devices, implants and grafts, initial encounter: Principal | ICD-10-CM | POA: Diagnosis present

## 2014-07-11 DIAGNOSIS — Y831 Surgical operation with implant of artificial internal device as the cause of abnormal reaction of the patient, or of later complication, without mention of misadventure at the time of the procedure: Secondary | ICD-10-CM | POA: Diagnosis present

## 2014-07-11 DIAGNOSIS — F17200 Nicotine dependence, unspecified, uncomplicated: Secondary | ICD-10-CM | POA: Diagnosis present

## 2014-07-11 DIAGNOSIS — Z79899 Other long term (current) drug therapy: Secondary | ICD-10-CM

## 2014-07-11 DIAGNOSIS — M545 Low back pain, unspecified: Secondary | ICD-10-CM | POA: Diagnosis present

## 2014-07-11 DIAGNOSIS — Z7982 Long term (current) use of aspirin: Secondary | ICD-10-CM

## 2014-07-11 DIAGNOSIS — Z981 Arthrodesis status: Secondary | ICD-10-CM

## 2014-07-11 SURGERY — POSTERIOR LUMBAR FUSION 1 WITH HARDWARE REMOVAL
Anesthesia: General | Site: Back

## 2014-07-11 MED ORDER — ONDANSETRON HCL 4 MG/2ML IJ SOLN: 4.0000 mg | INTRAMUSCULAR | Status: DC | PRN

## 2014-07-11 MED ORDER — SODIUM CHLORIDE 0.9 % IV SOLN
INTRAVENOUS | Status: DC | PRN
  Administered 2014-07-11: 07:00:00 via INTRAVENOUS

## 2014-07-11 MED ORDER — METHOCARBAMOL 500 MG PO TABS
ORAL_TABLET | ORAL | Status: DC
Start: 2014-07-11 — End: 2014-07-11
  Filled 2014-07-11: qty 1

## 2014-07-11 MED ORDER — GLYCOPYRROLATE 0.2 MG/ML IJ SOLN
INTRAMUSCULAR | Status: DC | PRN
  Administered 2014-07-11: 0.6 mg via INTRAVENOUS

## 2014-07-11 MED ORDER — SODIUM CHLORIDE 0.9 % IJ SOLN: 3.0000 mL | INTRAMUSCULAR | Status: DC | PRN

## 2014-07-11 MED ORDER — OXYCODONE HCL 5 MG PO TABS: 5.0000 mg | ORAL_TABLET | Freq: Once | ORAL | Status: DC | PRN

## 2014-07-11 MED ORDER — VECURONIUM BROMIDE 10 MG IV SOLR
INTRAVENOUS | Status: DC | PRN
  Administered 2014-07-11: 5 mg via INTRAVENOUS

## 2014-07-11 MED ORDER — NEOSTIGMINE METHYLSULFATE 10 MG/10ML IV SOLN
INTRAVENOUS | Status: AC
  Filled 2014-07-11: qty 2

## 2014-07-11 MED ORDER — ONDANSETRON HCL 4 MG/2ML IJ SOLN
INTRAMUSCULAR | Status: AC
  Filled 2014-07-11: qty 4

## 2014-07-11 MED ORDER — METHOCARBAMOL 750 MG PO TABS
750.0000 mg | ORAL_TABLET | Freq: Three times a day (TID) | ORAL | Status: DC
  Administered 2014-07-11: 500 mg via ORAL
  Administered 2014-07-11 (×2): 750 mg via ORAL
  Filled 2014-07-11 (×2): qty 1

## 2014-07-11 MED ORDER — HYDROMORPHONE HCL PF 1 MG/ML IJ SOLN
INTRAMUSCULAR | Status: AC
  Filled 2014-07-11: qty 1

## 2014-07-11 MED ORDER — THROMBIN 20000 UNITS EX SOLR
CUTANEOUS | Status: DC | PRN
  Administered 2014-07-11: 09:00:00 via TOPICAL

## 2014-07-11 MED ORDER — LACTATED RINGERS IV SOLN
INTRAVENOUS | Status: DC | PRN
  Administered 2014-07-11 (×2): via INTRAVENOUS

## 2014-07-11 MED ORDER — LIDOCAINE HCL (CARDIAC) 20 MG/ML IV SOLN
INTRAVENOUS | Status: AC
  Filled 2014-07-11: qty 5

## 2014-07-11 MED ORDER — MENTHOL 3 MG MT LOZG: 1.0000 | LOZENGE | OROMUCOSAL | Status: DC | PRN

## 2014-07-11 MED ORDER — MIDAZOLAM HCL 2 MG/2ML IJ SOLN
INTRAMUSCULAR | Status: AC
  Filled 2014-07-11: qty 2

## 2014-07-11 MED ORDER — SODIUM CHLORIDE 0.9 % IV SOLN: 250.0000 mL | INTRAVENOUS | Status: DC

## 2014-07-11 MED ORDER — EPHEDRINE SULFATE 50 MG/ML IJ SOLN
INTRAMUSCULAR | Status: DC | PRN
  Administered 2014-07-11: 5 mg via INTRAVENOUS

## 2014-07-11 MED ORDER — CALCIUM CARBONATE-VITAMIN D 500-200 MG-UNIT PO TABS
2.0000 | ORAL_TABLET | Freq: Two times a day (BID) | ORAL | Status: DC
  Administered 2014-07-11: 2 via ORAL
  Filled 2014-07-11: qty 2

## 2014-07-11 MED ORDER — ROCURONIUM BROMIDE 50 MG/5ML IV SOLN
INTRAVENOUS | Status: AC
  Filled 2014-07-11: qty 1

## 2014-07-11 MED ORDER — PROPOFOL 10 MG/ML IV BOLUS
INTRAVENOUS | Status: AC
  Filled 2014-07-11: qty 20

## 2014-07-11 MED ORDER — ARTIFICIAL TEARS OP OINT
TOPICAL_OINTMENT | OPHTHALMIC | Status: AC
  Filled 2014-07-11: qty 3.5

## 2014-07-11 MED ORDER — PHENYLEPHRINE 40 MCG/ML (10ML) SYRINGE FOR IV PUSH (FOR BLOOD PRESSURE SUPPORT)
PREFILLED_SYRINGE | INTRAVENOUS | Status: AC
  Filled 2014-07-11: qty 20

## 2014-07-11 MED ORDER — PROPOFOL 10 MG/ML IV BOLUS
INTRAVENOUS | Status: AC
Start: 2014-07-11 — End: 2014-07-11
  Filled 2014-07-11: qty 20

## 2014-07-11 MED ORDER — POTASSIUM CHLORIDE IN NACL 20-0.9 MEQ/L-% IV SOLN: INTRAVENOUS | Status: DC

## 2014-07-11 MED ORDER — SODIUM CHLORIDE 0.9 % IJ SOLN
3.0000 mL | Freq: Two times a day (BID) | INTRAMUSCULAR | Status: DC
  Administered 2014-07-11: 3 mL via INTRAVENOUS

## 2014-07-11 MED ORDER — FENTANYL CITRATE 0.05 MG/ML IJ SOLN
INTRAMUSCULAR | Status: AC
  Filled 2014-07-11: qty 5

## 2014-07-11 MED ORDER — OXYCODONE HCL 5 MG/5ML PO SOLN: 5.0000 mg | Freq: Once | ORAL | Status: DC | PRN

## 2014-07-11 MED ORDER — THROMBIN 5000 UNITS EX SOLR
OROMUCOSAL | Status: DC | PRN
  Administered 2014-07-11: 09:00:00 via TOPICAL

## 2014-07-11 MED ORDER — BUPIVACAINE HCL (PF) 0.25 % IJ SOLN
INTRAMUSCULAR | Status: DC | PRN
  Administered 2014-07-11: 5 mL

## 2014-07-11 MED ORDER — HEMOSTATIC AGENTS (NO CHARGE) OPTIME
TOPICAL | Status: DC | PRN
  Administered 2014-07-11: 1 via TOPICAL

## 2014-07-11 MED ORDER — PROMETHAZINE HCL 25 MG/ML IJ SOLN
6.2500 mg | INTRAMUSCULAR | Status: DC | PRN
Start: 2014-07-11 — End: 2014-07-11

## 2014-07-11 MED ORDER — MIDAZOLAM HCL 5 MG/5ML IJ SOLN
INTRAMUSCULAR | Status: DC | PRN
  Administered 2014-07-11: 2 mg via INTRAVENOUS

## 2014-07-11 MED ORDER — CITRUS CALCIUM/VITAMIN D 200-250 MG-UNIT PO TABS: 2.0000 | ORAL_TABLET | Freq: Two times a day (BID) | ORAL | Status: DC

## 2014-07-11 MED ORDER — ADULT MULTIVITAMIN W/MINERALS CH
1.0000 | ORAL_TABLET | Freq: Every day | ORAL | Status: DC
  Administered 2014-07-11: 1 via ORAL
  Filled 2014-07-11: qty 1

## 2014-07-11 MED ORDER — OXYCODONE-ACETAMINOPHEN 5-325 MG PO TABS
1.0000 | ORAL_TABLET | Freq: Four times a day (QID) | ORAL | Status: DC | PRN
  Administered 2014-07-11 – 2014-07-12 (×3): 2 via ORAL
  Filled 2014-07-11 (×3): qty 2

## 2014-07-11 MED ORDER — MORPHINE SULFATE 2 MG/ML IJ SOLN: 1.0000 mg | INTRAMUSCULAR | Status: DC | PRN

## 2014-07-11 MED ORDER — DEXAMETHASONE SODIUM PHOSPHATE 10 MG/ML IJ SOLN
10.0000 mg | INTRAMUSCULAR | Status: AC
  Administered 2014-07-11: 10 mg via INTRAVENOUS
  Filled 2014-07-11: qty 1

## 2014-07-11 MED ORDER — NEOSTIGMINE METHYLSULFATE 10 MG/10ML IV SOLN
INTRAVENOUS | Status: AC
  Filled 2014-07-11: qty 1

## 2014-07-11 MED ORDER — LIDOCAINE HCL (CARDIAC) 20 MG/ML IV SOLN
INTRAVENOUS | Status: AC
  Filled 2014-07-11: qty 10

## 2014-07-11 MED ORDER — GLYCOPYRROLATE 0.2 MG/ML IJ SOLN
INTRAMUSCULAR | Status: AC
  Filled 2014-07-11: qty 3

## 2014-07-11 MED ORDER — EPHEDRINE SULFATE 50 MG/ML IJ SOLN
INTRAMUSCULAR | Status: AC
  Filled 2014-07-11: qty 1

## 2014-07-11 MED ORDER — 0.9 % SODIUM CHLORIDE (POUR BTL) OPTIME
TOPICAL | Status: DC | PRN
  Administered 2014-07-11: 1000 mL

## 2014-07-11 MED ORDER — PNEUMOCOCCAL VAC POLYVALENT 25 MCG/0.5ML IJ INJ: 0.5000 mL | INJECTION | INTRAMUSCULAR | Status: DC

## 2014-07-11 MED ORDER — HYDROMORPHONE HCL PF 1 MG/ML IJ SOLN
0.2500 mg | INTRAMUSCULAR | Status: DC | PRN
  Administered 2014-07-11 (×3): 0.5 mg via INTRAVENOUS

## 2014-07-11 MED ORDER — FENTANYL CITRATE 0.05 MG/ML IJ SOLN
INTRAMUSCULAR | Status: DC | PRN
  Administered 2014-07-11: 100 ug via INTRAVENOUS
  Administered 2014-07-11 (×6): 50 ug via INTRAVENOUS
  Administered 2014-07-11: 100 ug via INTRAVENOUS

## 2014-07-11 MED ORDER — ZOLPIDEM TARTRATE 5 MG PO TABS: 5.0000 mg | ORAL_TABLET | Freq: Every evening | ORAL | Status: DC | PRN

## 2014-07-11 MED ORDER — ACETAMINOPHEN 325 MG PO TABS
650.0000 mg | ORAL_TABLET | ORAL | Status: DC | PRN
Start: 2014-07-11 — End: 2014-07-12

## 2014-07-11 MED ORDER — STERILE WATER FOR INJECTION IJ SOLN
INTRAMUSCULAR | Status: AC
  Filled 2014-07-11: qty 10

## 2014-07-11 MED ORDER — PANTOPRAZOLE SODIUM 40 MG PO TBEC: 40.0000 mg | DELAYED_RELEASE_TABLET | Freq: Every day | ORAL | Status: DC | PRN

## 2014-07-11 MED ORDER — ACETAMINOPHEN 650 MG RE SUPP: 650.0000 mg | RECTAL | Status: DC | PRN

## 2014-07-11 MED ORDER — ARTIFICIAL TEARS OP OINT
TOPICAL_OINTMENT | OPHTHALMIC | Status: DC | PRN
  Administered 2014-07-11: 1 via OPHTHALMIC

## 2014-07-11 MED ORDER — ASPIRIN EC 81 MG PO TBEC
81.0000 mg | DELAYED_RELEASE_TABLET | Freq: Every day | ORAL | Status: DC
  Administered 2014-07-11: 81 mg via ORAL
  Filled 2014-07-11: qty 1

## 2014-07-11 MED ORDER — ONDANSETRON HCL 4 MG/2ML IJ SOLN
INTRAMUSCULAR | Status: DC | PRN
  Administered 2014-07-11 (×2): 4 mg via INTRAVENOUS

## 2014-07-11 MED ORDER — PROPOFOL 10 MG/ML IV BOLUS
INTRAVENOUS | Status: DC | PRN
  Administered 2014-07-11: 140 mg via INTRAVENOUS

## 2014-07-11 MED ORDER — SODIUM CHLORIDE 0.9 % IJ SOLN
INTRAMUSCULAR | Status: AC
  Filled 2014-07-11: qty 10

## 2014-07-11 MED ORDER — LIDOCAINE HCL (CARDIAC) 20 MG/ML IV SOLN
INTRAVENOUS | Status: DC | PRN
  Administered 2014-07-11: 70 mg via INTRAVENOUS
  Administered 2014-07-11: 50 mg via INTRAVENOUS

## 2014-07-11 MED ORDER — SODIUM CHLORIDE 0.9 % IV SOLN
10.0000 mg | INTRAVENOUS | Status: DC | PRN
  Administered 2014-07-11: 15 ug/min via INTRAVENOUS

## 2014-07-11 MED ORDER — NEOSTIGMINE METHYLSULFATE 10 MG/10ML IV SOLN
INTRAVENOUS | Status: DC | PRN
  Administered 2014-07-11: 5 mg via INTRAVENOUS

## 2014-07-11 MED ORDER — PHENOL 1.4 % MT LIQD
1.0000 | OROMUCOSAL | Status: DC | PRN
  Administered 2014-07-11: 1 via OROMUCOSAL
  Filled 2014-07-11: qty 177

## 2014-07-11 MED ORDER — ROCURONIUM BROMIDE 100 MG/10ML IV SOLN
INTRAVENOUS | Status: DC | PRN
  Administered 2014-07-11: 50 mg via INTRAVENOUS

## 2014-07-11 SURGICAL SUPPLY — 50 items
BAG DECANTER FOR FLEXI CONT (MISCELLANEOUS) ×3 IMPLANT
BENZOIN TINCTURE PRP APPL 2/3 (GAUZE/BANDAGES/DRESSINGS) ×3 IMPLANT
BLADE SURG ROTATE 9660 (MISCELLANEOUS) IMPLANT
BONE MATRIX OSTEOCEL PRO MED (Bone Implant) ×6 IMPLANT
BUR MATCHSTICK NEURO 3.0 LAGG (BURR) ×3 IMPLANT
CANISTER SUCT 3000ML (MISCELLANEOUS) ×3 IMPLANT
CLOSURE WOUND 1/2 X4 (GAUZE/BANDAGES/DRESSINGS) ×1
CONT SPEC 4OZ CLIKSEAL STRL BL (MISCELLANEOUS) ×6 IMPLANT
COVER BACK TABLE 24X17X13 BIG (DRAPES) IMPLANT
COVER TABLE BACK 60X90 (DRAPES) ×3 IMPLANT
DRAPE C-ARM 42X72 X-RAY (DRAPES) ×6 IMPLANT
DRAPE LAPAROTOMY 100X72X124 (DRAPES) ×3 IMPLANT
DRAPE POUCH INSTRU U-SHP 10X18 (DRAPES) ×3 IMPLANT
DRAPE SURG 17X23 STRL (DRAPES) ×3 IMPLANT
DRSG OPSITE 4X5.5 SM (GAUZE/BANDAGES/DRESSINGS) ×6 IMPLANT
DRSG OPSITE POSTOP 4X8 (GAUZE/BANDAGES/DRESSINGS) ×3 IMPLANT
DRSG TELFA 3X8 NADH (GAUZE/BANDAGES/DRESSINGS) ×3 IMPLANT
DURAPREP 26ML APPLICATOR (WOUND CARE) ×3 IMPLANT
ELECT REM PT RETURN 9FT ADLT (ELECTROSURGICAL) ×3
ELECTRODE REM PT RTRN 9FT ADLT (ELECTROSURGICAL) ×1 IMPLANT
EVACUATOR 1/8 PVC DRAIN (DRAIN) ×3 IMPLANT
GAUZE SPONGE 4X4 16PLY XRAY LF (GAUZE/BANDAGES/DRESSINGS) IMPLANT
GLOVE BIO SURGEON STRL SZ8 (GLOVE) ×6 IMPLANT
GOWN STRL REUS W/ TWL LRG LVL3 (GOWN DISPOSABLE) IMPLANT
GOWN STRL REUS W/ TWL XL LVL3 (GOWN DISPOSABLE) ×2 IMPLANT
GOWN STRL REUS W/TWL 2XL LVL3 (GOWN DISPOSABLE) IMPLANT
GOWN STRL REUS W/TWL LRG LVL3 (GOWN DISPOSABLE)
GOWN STRL REUS W/TWL XL LVL3 (GOWN DISPOSABLE) ×4
HEMOSTAT POWDER KIT SURGIFOAM (HEMOSTASIS) IMPLANT
KIT BASIN OR (CUSTOM PROCEDURE TRAY) ×3 IMPLANT
KIT ROOM TURNOVER OR (KITS) ×3 IMPLANT
NEEDLE HYPO 25X1 1.5 SAFETY (NEEDLE) ×3 IMPLANT
NS IRRIG 1000ML POUR BTL (IV SOLUTION) ×3 IMPLANT
PACK LAMINECTOMY NEURO (CUSTOM PROCEDURE TRAY) ×3 IMPLANT
PAD ARMBOARD 7.5X6 YLW CONV (MISCELLANEOUS) ×9 IMPLANT
SCREW LOCK (Screw) ×8 IMPLANT
SCREW LOCK FXNS SPNE MAS PL (Screw) ×4 IMPLANT
SPONGE LAP 4X18 X RAY DECT (DISPOSABLE) IMPLANT
SPONGE SURGIFOAM ABS GEL 100 (HEMOSTASIS) ×3 IMPLANT
STRIP CLOSURE SKIN 1/2X4 (GAUZE/BANDAGES/DRESSINGS) ×2 IMPLANT
SUT VIC AB 0 CT1 18XCR BRD8 (SUTURE) ×1 IMPLANT
SUT VIC AB 0 CT1 8-18 (SUTURE) ×2
SUT VIC AB 2-0 CP2 18 (SUTURE) ×3 IMPLANT
SUT VIC AB 3-0 SH 8-18 (SUTURE) ×6 IMPLANT
SYR 20ML ECCENTRIC (SYRINGE) ×3 IMPLANT
TOWEL OR 17X24 6PK STRL BLUE (TOWEL DISPOSABLE) ×3 IMPLANT
TOWEL OR 17X26 10 PK STRL BLUE (TOWEL DISPOSABLE) ×3 IMPLANT
TRAP SPECIMEN MUCOUS 40CC (MISCELLANEOUS) IMPLANT
TRAY FOLEY CATH 14FRSI W/METER (CATHETERS) ×3 IMPLANT
WATER STERILE IRR 1000ML POUR (IV SOLUTION) ×3 IMPLANT

## 2014-07-11 NOTE — Progress Notes (Signed)
UR completed by Shakyla Nolley RN, BSN 

## 2014-07-11 NOTE — Op Note (Signed)
07/11/2014  9:53 AM  PATIENT:  Lori Howard  63 y.o. 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 lumbar reexploration with exploration of fusion L5-S1 to confirm pseudoarthrosis 2. removal of instrumentation L5-S1 with replacement of nonsegmental instrumentation 3. harvesting of right iliac crest morcellized autograft through a separate fascial incision  4. Intertransverse arthrodesis L5-S1 bilaterally using morcellized autograft and allograft.  SURGEON:  Marikay Alar, MD  ASSISTANTS: Dr. Wynetta Emery  ANESTHESIA:  General  EBL: 100 ml  Total I/O In: 1500 [I.V.:1500] Out: 500 [Urine:400; Blood:100]  BLOOD ADMINISTERED:none  DRAINS: None  INDICATION FOR PROCEDURE: This patient presented to over a year out from her lumbar interbody fusion with recurrent back and leg pain. CT scan showed a pseudoarthrosis at L5-S1 without obvious loosening of instrumentation. She tried medical management without relief. I recommended lumbar reexploration with exploration of fusion and redo fusion at L5-S1.  Patient understood the risks, benefits, and alternatives and potential outcomes and wished to proceed.  PROCEDURE DETAILS:  The patient was brought to the operating room. After induction of generalized endotracheal anesthesia the patient was rolled into the prone position on chest rolls and all pressure points were padded. The patient's lumbar region was cleaned and then prepped with DuraPrep and draped in the usual sterile fashion. Anesthesia was injected and then a dorsal midline incision was made and carried down to the lumbosacral fascia. The fascia was opened and the paraspinous musculature was taken down in a subperiosteal fashion to expose the old hardware at L5-S1. A self-retaining retractor was placed. I removed the locking caps from the tulip heads and then removed the rods. I pulled on each screw and found  that they had good purchase. I then exposed the lateral gutter and exposed the transverse process of L5 bilaterally as well as the sacral ala. I then explore the fusion at L5-S1 and found it to be pseudoarthrosed. I then performed a suprafascial dissection to the right iliac crest and exposed the right iliac crest. I then used a combination of chisels and gouges to remove morcellized autograft from the right iliac crest. I then dried the crest with Surgifoam and dry Gelfoam after irrigating with saline solution, closed the fascia over the iliac crest and close dissection plane. We then decorticated the transverse processes and laid a mixture of morcellized autograft and allograft out over these to perform intertransverse arthrodesis at L5-S1 bilaterally. We then placed lordotic rods into the multiaxial screw heads of the pedicle screws and locked these in position with the locking caps and anti-torque device. Irrigated with copious amounts of bacitracin-containing saline solution. I closed the muscle and the fascia with 0 Vicryl. Closed the subcutaneous tissues with 2-0 Vicryl and subcuticular tissues with 3-0 Vicryl. The skin was closed with benzoin and Steri-Strips. Dressing was then applied, the patient was awakened from general anesthesia and transported to the recovery room in stable condition. At the end of the procedure all sponge, needle and instrument counts were correct.   PLAN OF CARE: Admit to inpatient   PATIENT DISPOSITION:  PACU - hemodynamically stable.   Delay start of Pharmacological VTE agent (>24hrs) due to surgical blood loss or risk of bleeding:  yes

## 2014-07-11 NOTE — Anesthesia Preprocedure Evaluation (Signed)
Anesthesia Evaluation  Patient identified by MRN, date of birth, ID band Patient awake    Reviewed: Allergy & Precautions, H&P , NPO status , Patient's Chart, lab work & pertinent test results  History of Anesthesia Complications (+) Family history of anesthesia reaction and history of anesthetic complications  Airway Mallampati: II  Neck ROM: Full    Dental  (+) Teeth Intact, Dental Advisory Given   Pulmonary Current Smoker,  breath sounds clear to auscultation        Cardiovascular Rhythm:Regular Rate:Normal     Neuro/Psych    GI/Hepatic GERD-  ,  Endo/Other    Renal/GU      Musculoskeletal  (+) Arthritis -,   Abdominal (+) + obese,   Peds  Hematology   Anesthesia Other Findings   Reproductive/Obstetrics                           Anesthesia Physical Anesthesia Plan  ASA: II  Anesthesia Plan: General   Post-op Pain Management:    Induction: Intravenous  Airway Management Planned: Oral ETT  Additional Equipment:   Intra-op Plan:   Post-operative Plan: Extubation in OR  Informed Consent: I have reviewed the patients History and Physical, chart, labs and discussed the procedure including the risks, benefits and alternatives for the proposed anesthesia with the patient or authorized representative who has indicated his/her understanding and acceptance.   Dental advisory given  Plan Discussed with: CRNA and Surgeon  Anesthesia Plan Comments:         Anesthesia Quick Evaluation

## 2014-07-11 NOTE — Evaluation (Signed)
Physical Therapy Evaluation Patient Details Name: Lori Howard MRN: [id number] DOB: 1951/05/17 Today's Date: 07/11/2014   History of Present Illness  Patient is a 63 y/o female admitted with pseudoarthrosis L5/S1 now s/p Intertransverse arthrodesis L5-S1 bilaterally using morcellized autograft and allograft.  Clinical Impression  Patient presents close to functional baseline. Just needs extra time and walker for mobility.  Has needed DME and capable assist at home.  Feel she is independent in back precautions and donning her brace.  No further skilled PT needs at this time.    Follow Up Recommendations No PT follow up    Equipment Recommendations  None recommended by PT    Recommendations for Other Services       Precautions / Restrictions Precautions Precautions: Back Required Braces or Orthoses: Spinal Brace Spinal Brace: Applied in sitting position;Lumbar corset      Mobility  Bed Mobility Overal bed mobility: Needs Assistance Bed Mobility: Rolling;Sidelying to Sit Rolling: Supervision Sidelying to sit: Supervision       General bed mobility comments: use of rail and min cues for precautions  Transfers Overall transfer level: Needs assistance Equipment used: Rolling walker (2 wheeled) Transfers: Sit to/from Stand Sit to Stand: Min guard         General transfer comment: up from bed, then from toilet min cues for positioning and precautions  Ambulation/Gait Ambulation/Gait assistance: Min guard;Supervision Ambulation Distance (Feet): 150 Feet Assistive device: Rolling walker (2 wheeled) Gait Pattern/deviations: Step-through pattern     General Gait Details: reports uses 4W walker at home, educated on safety with keeping walker close and using brakes frequently  Stairs Stairs: Yes Stairs assistance: Min assist Stair Management: One rail Right;Step to pattern;Forwards Number of Stairs: 3 General stair comments: 4" steps with min use of rail to  simulate the cane and hand held assist (step through to ascend steps, step to for descending steps)  Wheelchair Mobility    Modified Rankin (Stroke Patients Only)       Balance Overall balance assessment: Modified Independent                                           Pertinent Vitals/Pain Pain Assessment: 0-10 Pain Score: 3  Pain Descriptors / Indicators: Other (Comment) (surgical) Pain Intervention(s): Monitored during session    Home Living Family/patient expects to be discharged to:: Private residence Living Arrangements: Spouse/significant other;Children Available Help at Discharge: Family;Available 24 hours/day Type of Home: House Home Access: Stairs to enter Entrance Stairs-Rails: None Entrance Stairs-Number of Steps: 2 small steps at front Home Layout: Two level;Able to live on main level with bedroom/bathroom Home Equipment: Gilmer Mor - single point;Tub bench;Hand held shower head;Walker - 4 wheels      Prior Function Level of Independence: Independent               Hand Dominance   Dominant Hand: Right    Extremity/Trunk Assessment               Lower Extremity Assessment: Overall WFL for tasks assessed         Communication   Communication: No difficulties  Cognition Arousal/Alertness: Awake/alert Behavior During Therapy: WFL for tasks assessed/performed Overall Cognitive Status: Within Functional Limits for tasks assessed                      General Comments  Exercises        Assessment/Plan    PT Assessment Patent does not need any further PT services  PT Diagnosis     PT Problem List    PT Treatment Interventions     PT Goals (Current goals can be found in the Care Plan section) Acute Rehab PT Goals PT Goal Formulation: No goals set, d/c therapy    Frequency     Barriers to discharge        Co-evaluation               End of Session Equipment Utilized During Treatment: Back  brace Activity Tolerance: Patient tolerated treatment well Patient left: in chair;with call bell/phone within reach;with family/visitor present      Functional Assessment Tool Used: Clinical Judgement Functional Limitation: Mobility: Walking and moving around Mobility: Walking and Moving Around Current Status ([id number]): At least 20 percent but less than 40 percent impaired, limited or restricted Mobility: Walking and Moving Around Goal Status [phone]): At least 1 percent but less than 20 percent impaired, limited or restricted Mobility: Walking and Moving Around Discharge Status [phone]): At least 1 percent but less than 20 percent impaired, limited or restricted    Time: 1350-1419 PT Time Calculation (min): 29 min   Charges:   PT Evaluation $Initial PT Evaluation Tier I: 1 Procedure PT Treatments $Gait Training: 8-22 mins   PT G Codes:   Functional Assessment Tool Used: Clinical Judgement Functional Limitation: Mobility: Walking and moving around    [facility] 07/11/2014, 3:56 PM Eva, [place] [phone] 07/11/2014

## 2014-07-11 NOTE — Anesthesia Procedure Notes (Signed)
Procedure Name: Intubation Date/Time: 07/11/2014 7:45 AM Performed by: Wray Kearns A Pre-anesthesia Checklist: Patient identified, Timeout performed, Emergency Drugs available, Suction available and Patient being monitored Patient Re-evaluated:Patient Re-evaluated prior to inductionOxygen Delivery Method: Circle system utilized Preoxygenation: Pre-oxygenation with 100% oxygen Intubation Type: IV induction and Cricoid Pressure applied Ventilation: Mask ventilation without difficulty Laryngoscope Size: Mac and 4 Grade View: Grade I Tube type: Oral Tube size: 7.5 mm Number of attempts: 1 Airway Equipment and Method: Stylet Placement Confirmation: ETT inserted through vocal cords under direct vision,  breath sounds checked- equal and bilateral and positive ETCO2 Secured at: 23 cm Tube secured with: Tape Dental Injury: Teeth and Oropharynx as per pre-operative assessment

## 2014-07-11 NOTE — H&P (Signed)
Subjective: Patient is a 63 y.o. female admitted for re-do L5-S1 fusion . Onset of symptoms was a few months ago, progressively worse since that time.  The pain is rated severe, and is located at the low back and radiates to legs. The pain is described as aching and occurs intermittently but qd. The symptoms have been progressive. Symptoms are exacerbated by exercise. MRI or CT showed pseudoarthrosis L5-S1.  Past Medical History  Diagnosis Date  . Family history of anesthesia complication     mother coded during surgery - in her 7s  . GERD (gastroesophageal reflux disease)   . Complication of anesthesia     "twilight sleep doesn't work on me" (08/16/2012)  . Pneumonia ~ 2008  . History of bronchitis     "related to sinus infections" (08/16/2012)  . Sinus headache   . Migraines     "very limited now; triggered by heat & humidity" (08/16/2012)  . Arthritis     "spine, hands, feet; not bad anywhere" (08/16/2012)  . Chronic lower back pain     Past Surgical History  Procedure Laterality Date  . Foot surgery  06/27/2012    bilat - toe joint surgery  . Posterior fusion lumbar spine  08/15/2012  . Tubal ligation  1986  . Wisdom tooth extraction  1970's    "all 4" (08/16/2012)    Prior to Admission medications   Medication Sig Start Date End Date Taking? Authorizing Provider  Ascorbic Acid (VITAMIN C) 1000 MG tablet Take 1,000 mg by mouth 2 (two) times daily.   Yes Historical Provider, MD  aspirin EC 81 MG tablet Take 81 mg by mouth daily.   Yes Historical Provider, MD  Calcium Citrate-Vitamin D (CITRUS CALCIUM/VITAMIN D) 200-250 MG-UNIT TABS Take 2 tablets by mouth 2 (two) times daily.   Yes Historical Provider, MD  Boris Lown Oil 300 MG CAPS Take 300 mg by mouth 2 (two) times daily.   Yes Historical Provider, MD  loratadine (CLARITIN) 10 MG tablet Take 10 mg by mouth daily.   Yes Historical Provider, MD  Magnesium 100 MG CAPS Take 100 mg by mouth daily.   Yes Historical Provider, MD   meloxicam (MOBIC) 7.5 MG tablet Take 7.5 mg by mouth 2 (two) times daily.   Yes Historical Provider, MD  methocarbamol (ROBAXIN) 750 MG tablet Take 750 mg by mouth 3 (three) times daily.   Yes Historical Provider, MD  Multiple Vitamin (MULTIVITAMIN WITH MINERALS) TABS Take 1 tablet by mouth daily.   Yes Historical Provider, MD  oxyCODONE-acetaminophen (PERCOCET/ROXICET) 5-325 MG per tablet Take 1-2 tablets by mouth every 6 (six) hours as needed for pain. 08/16/12  Yes Tia Alert, MD  pantoprazole (PROTONIX) 40 MG tablet Take 40 mg by mouth daily as needed (for reflux).    Yes Historical Provider, MD  Red Yeast Rice 600 MG CAPS Take 600 mg by mouth 2 (two) times daily.   Yes Historical Provider, MD  valACYclovir (VALTREX) 1000 MG tablet Take 1,000 mg by mouth daily as needed (for flare ups).    Yes Historical Provider, MD   Allergies  Allergen Reactions  . Lamisil [Terbinafine Hcl] Shortness Of Breath and Other (See Comments)    Wheezing   . Lyrica [Pregabalin] Other (See Comments)    Tunnel vision;" loss of balance; bad taste and smell"  . Neosporin [Neomycin-Bacitracin Zn-Polymyx] Swelling and Other (See Comments)    Swelling, redness, blisters  . Fluconazole Rash  . Penicillins Itching and Rash  . Cymbalta [  Duloxetine Hcl] Other (See Comments)    Swelling/burning of feet.  . Voltaren [Diclofenac Sodium] Swelling    Facial swelling.  . Sulfa Antibiotics Itching and Rash    "not sure of this allergy; had eye infection; took drops; eyes itched like crazy; dr said it could be the sulfa or preservatives.  I've not had any trouble w/any other eye drops" (08/16/2012)    History  Substance Use Topics  . Smoking status: Current Every Day Smoker -- 0.50 packs/day for 47 years    Types: Cigarettes  . Smokeless tobacco: Never Used  . Alcohol Use: No    History reviewed. No pertinent family history.   Review of Systems  Positive ROS: neg  All other systems have been reviewed and were  otherwise negative with the exception of those mentioned in the HPI and as above.  Objective: Vital signs in last 24 hours: Temp:  [97.9 F (36.6 C)] 97.9 F (36.6 C) (09/04 0603) Pulse Rate:  [80] 80 (09/04 0603) Resp:  [18] 18 (09/04 0603) BP: (128)/(47) 128/47 mmHg (09/04 0603) SpO2:  [99 %] 99 % (09/04 0603) Weight:  [97.523 kg (215 lb)] 97.523 kg (215 lb) (09/04 0603)  General Appearance: Alert, cooperative, no distress, appears stated age Head: Normocephalic, without obvious abnormality, atraumatic Eyes: PERRL, conjunctiva/corneas clear, EOM's intact    Neck: Supple, symmetrical, trachea midline Back: Symmetric, no curvature, ROM normal, no CVA tenderness Lungs:  respirations unlabored Heart: Regular rate and rhythm Abdomen: Soft, non-tender Extremities: Extremities normal, atraumatic, no cyanosis or edema Pulses: 2+ and symmetric all extremities Skin: Skin color, texture, turgor normal, no rashes or lesions  NEUROLOGIC:   Mental status: Alert and oriented x4,  no aphasia, good attention span, fund of knowledge, and memory Motor Exam - grossly normal Sensory Exam - grossly normal Reflexes: normal Coordination - grossly normal Gait - grossly normal Balance - grossly normal Cranial Nerves: I: smell Not tested  II: visual acuity  OS: nl    OD: nl  II: visual fields Full to confrontation  II: pupils Equal, round, reactive to light  III,VII: ptosis None  III,IV,VI: extraocular muscles  Full ROM  V: mastication Normal  V: facial light touch sensation  Normal  V,VII: corneal reflex  Present  VII: facial muscle function - upper  Normal  VII: facial muscle function - lower Normal  VIII: hearing Not tested  IX: soft palate elevation  Normal  IX,X: gag reflex Present  XI: trapezius strength  5/5  XI: sternocleidomastoid strength 5/5  XI: neck flexion strength  5/5  XII: tongue strength  Normal    Data Review Lab Results  Component Value Date   WBC 9.7 07/04/2014    HGB 15.0 07/04/2014   HCT 43.9 07/04/2014   MCV 99.3 07/04/2014   PLT 208 07/04/2014   Lab Results  Component Value Date   NA 142 07/04/2014   K 4.9 07/04/2014   CL 103 07/04/2014   CO2 23 07/04/2014   BUN 23 07/04/2014   CREATININE 1.17* 07/04/2014   GLUCOSE 104* 07/04/2014   Lab Results  Component Value Date   INR 0.97 07/04/2014    Assessment/Plan: Patient admitted for re-do L5-S1 fusion. Patient has failed a reasonable attempt at conservative therapy.  I explained the condition and procedure to the patient and answered any questions.  Patient wishes to proceed with procedure as planned. Understands risks/ benefits and typical outcomes of procedure.   Hulbert Branscome S 07/11/2014 6:50 AM

## 2014-07-11 NOTE — Anesthesia Postprocedure Evaluation (Signed)
  Anesthesia Post-op Note  Patient: Lori Howard  Procedure(s) Performed: Procedure(s): POSTERIOR LUMBAR FUSION 1 WITH HARDWARE REMOVAL  Lumbar five sacral one (N/A)  Patient Location: PACU  Anesthesia Type:General  Level of Consciousness: awake and alert   Airway and Oxygen Therapy: Patient Spontanous Breathing  Post-op Pain: mild  Post-op Assessment: Post-op Vital signs reviewed  Post-op Vital Signs: stable  Last Vitals:  Filed Vitals:   07/11/14 1030  BP: 153/82  Pulse: 76  Temp:   Resp: 13    Complications: No apparent anesthesia complications

## 2014-07-11 NOTE — Transfer of Care (Signed)
Immediate Anesthesia Transfer of Care Note  Patient: Lori Howard  Procedure(s) Performed: Procedure(s): POSTERIOR LUMBAR FUSION 1 WITH HARDWARE REMOVAL  Lumbar five sacral one (N/A)  Patient Location: PACU  Anesthesia Type:General  Level of Consciousness: oriented, sedated, patient cooperative and responds to stimulation  Airway & Oxygen Therapy: Patient Spontanous Breathing and Patient connected to nasal cannula oxygen  Post-op Assessment: Report given to PACU RN, Post -op Vital signs reviewed and stable, Patient moving all extremities and Patient moving all extremities X 4  Post vital signs: Reviewed and stable  Complications: No apparent anesthesia complications

## 2014-07-12 MED ORDER — OXYCODONE-ACETAMINOPHEN 10-325 MG PO TABS: 1.0000 | ORAL_TABLET | ORAL | Status: AC | PRN

## 2014-07-12 NOTE — Progress Notes (Signed)
Pt d/c to home by car with family. Assessment stable. Prescriptions given. 

## 2014-07-12 NOTE — Discharge Instructions (Signed)
Spinal Fusion °Care After  °These instructions give you information on caring for yourself after your procedure. Your doctor may also give you more specific instructions. Call your doctor if you have any problems or questions after your procedure. °HOME CARE °· Only take medicines as told by your doctor. °· Do not drive if you are taking certain pain medicines (narcotics). °· Change your bandage (dressing) as told by your doctor. °· Do not get the wound wet. Do not take baths or swim until your doctor says it is okay. °· Check the wound often for redness, puffiness (swelling), or leaking fluids. °· Ask your doctor what activities you should avoid and for how long. °· Walk as much as you can.  Do not sit for long periods of time. Change positions every hour. °· Do not lift anything heavier than 5 to 10 pounds (2.25 to 4.5 kilograms) until your doctor says it is safe. °· Do not twist or bend for 6 weeks. Try not to pull on things. Do not hold things away from your body. °· Ask your doctor what exercises you should do. Exercises can help make the back stronger. °GET HELP RIGHT AWAY IF: °· Your pain suddenly gets worse. °· The wound is red, puffy, bleeding, or leaking fluid. °· Your legs or feet are painful, puffy, weak, or lose feeling (numb). °· You cannot control when you pee (urinate) or poop (bowel movement). °· You have trouble breathing. °· You have chest pain. °· You have a temperature by mouth above 102° F (38.9° C), not controlled by medicine. °MAKE SURE YOU: °· Understand these instructions. °· Will watch your condition. °· Will get help right away if you are not doing well or get worse. °Document Released: 02/17/2011 Document Revised: 01/16/2012 Document Reviewed: 02/17/2011 °[employer]® Patient Information ©2015 [employer]. This information is not intended to replace advice given to you by your health care provider. Make sure you discuss any questions you have with your health care provider. °Spinal  Fusion °Spinal fusion is a procedure to make 2 or more of the bones in your spinal column (vertebrae) grow together (fuse). This procedure stops movement between the vertebrae and can relieve pain and prevent deformity.  °Spinal fusion is used to treat the following conditions: °· Fractures of the spine. °· Herniated disk (the spongy material [cartilage] between the vertebrae). °· Abnormal curvatures of the spine, such as scoliosis or kyphosis. °· A weak or an unstable spine, caused by infections or tumor. °RISKS AND COMPLICATIONS °Complications associated with spinal fusion are rare, but they can occur. Possible complications include: °· Bleeding. °· Infection near the incision. °· Nerve damage. Signs of nerve damage are back pain, pain in one or both legs, weakness, or numbness. °· Spinal fluid leakage. °· Blood clot in your leg, which can move to your lungs. °· Difficulty controlling urination or bowel movements. °BEFORE THE PROCEDURE °· A medical evaluation will be done. This will include a physical exam, blood tests, and imaging exams. °· You will talk with an anesthesiologist. This is the person who will be in charge of the anesthesia during the procedure. Spinal fusion usually requires that you are asleep during the procedure (general anesthesia). °· You will need to stop taking certain medicines, particularly those associated with an increased risk of bleeding. Ask your caregiver about changing or stopping your regular medicines. °· If you smoke, you will need to stop at least 2 weeks before the procedure. Smoking can slow down the healing process,   especially fusion of the vertebrae, and increase the risk of complications. °· Do not eat or drink anything for at least 8 hours before the procedure. °PROCEDURE  °A cut (incision) is made over the vertebrae that will be fused. The back muscles are separated from the vertebrae. If you are having this procedure to treat a herniated disk, the disc material pressing  on the nerve root is removed (decompression). The area where the disk is removed is then filled with extra bone. Bone from another part of your body (autogenous bone) or bone from a bone donor (allograft bone) may be used. The extra bone promotes fusion between the vertebrae. Sometimes, specific medicines are added to the fusion area to promote bone healing. In most cases, screws and rods or metal plates will be used to attach the vertebrae to stabilize them while they fuse.  °AFTER THE PROCEDURE  °· You will stay in a recovery area until the anesthesia has worn off. Your blood pressure and pulse will be checked frequently. °· You will be given antibiotics to prevent infection. °· You may continue to receive fluids through an intravenous (IV) tube while you are still in the hospital. °· Pain after surgery is normal. You will be given pain medicine. °· You will be taught how to move correctly and how to stand and walk. While in bed, you will be instructed to turn frequently, using a "log rolling" technique, in which the entire body is moved without twisting the back. °Document Released: 07/23/2003 Document Revised: 01/16/2012 Document Reviewed: 01/06/2011 °ExitCare® Patient Information ©2015 [employer]. This information is not intended to replace advice given to you by your health care provider. Make sure you discuss any questions you have with your health care provider. ° °

## 2014-07-12 NOTE — Discharge Summary (Signed)
Physician Discharge Summary  Patient ID: Lori Howard MRN: [id number] DOB/AGE: 64/10/1951 64 y.o.  Admit date: 07/11/2014 Discharge date: 07/12/2014  Admission Diagnoses:Pseudoarthrosis L 5 S 1  Discharge Diagnoses: Pseudoarthrosis L 5 S 1  Active Problems:   S/P lumbar spinal fusion   Discharged Condition: good  Hospital Course: Patient underwent exploration of fusion and revision of L 5 S 1 fusion.  She did well post-operatively.  Consults: None  Significant Diagnostic Studies: None  Treatments: surgery: exploration of fusion and revision of L 5 S 1 fusion  Discharge Exam: Blood pressure 102/65, pulse 84, temperature 98.2 F (36.8 C), temperature source Oral, resp. rate 20, weight 97.523 kg (215 lb), SpO2 98.00%. Neurologic: Alert and oriented X 3, normal strength and tone. Normal symmetric reflexes. Normal coordination and gait Wound:CDI  Disposition: Home     Medication List    STOP taking these medications       meloxicam 7.5 MG tablet  Commonly known as:  MOBIC      TAKE these medications       aspirin EC 81 MG tablet  Take 81 mg by mouth daily.     Citrus Calcium/Vitamin D 200-250 MG-UNIT Tabs  Take 2 tablets by mouth 2 (two) times daily.     Krill Oil 300 MG Caps  Take 300 mg by mouth 2 (two) times daily.     loratadine 10 MG tablet  Commonly known as:  CLARITIN  Take 10 mg by mouth daily.     Magnesium 100 MG Caps  Take 100 mg by mouth daily.     methocarbamol 750 MG tablet  Commonly known as:  ROBAXIN  Take 750 mg by mouth 3 (three) times daily.     multivitamin with minerals Tabs tablet  Take 1 tablet by mouth daily.     oxyCODONE-acetaminophen 5-325 MG per tablet  Commonly known as:  PERCOCET/ROXICET  Take 1-2 tablets by mouth every 6 (six) hours as needed for pain.     oxyCODONE-acetaminophen 10-325 MG per tablet  Commonly known as:  PERCOCET  Take 1 tablet by mouth every 4 (four) hours as needed for pain.     pantoprazole  40 MG tablet  Commonly known as:  PROTONIX  Take 40 mg by mouth daily as needed (for reflux).     Red Yeast Rice 600 MG Caps  Take 600 mg by mouth 2 (two) times daily.     valACYclovir 1000 MG tablet  Commonly known as:  VALTREX  Take 1,000 mg by mouth daily as needed (for flare ups).     vitamin C 1000 MG tablet  Take 1,000 mg by mouth 2 (two) times daily.         Signed: Dorian Heckle, MD 07/12/2014, 6:39 AM

## 2014-07-12 NOTE — Progress Notes (Signed)
Subjective: Patient reports doing well  Objective: Vital signs in last 24 hours: Temp:  [97.6 F (36.4 C)-98.4 F (36.9 C)] 98.2 F (36.8 C) (09/05 0558) Pulse Rate:  [64-92] 84 (09/05 0558) Resp:  [11-20] 20 (09/05 0558) BP: (64-153)/(41-82) 102/65 mmHg (09/05 0558) SpO2:  [95 %-100 %] 98 % (09/05 0558)  Intake/Output from previous day: 09/04 0701 - 09/05 0700 In: 3140 [P.O.:990; I.V.:2150] Out: 850 [Urine:750; Blood:100] Intake/Output this shift: Total I/O In: 240 [P.O.:240] Out: -   Physical Exam: Full strength.  Dressing CDI.  Lab Results: No results found for this basename: WBC, HGB, HCT, PLT,  in the last 72 hours BMET No results found for this basename: NA, K, CL, CO2, GLUCOSE, BUN, CREATININE, CALCIUM,  in the last 72 hours  Studies/Results: No results found.  Assessment/Plan: Doing well.  D/C home.    LOS: 1 day    Dorian Heckle, MD 07/12/2014, 6:39 AM

## 2014-09-09 ENCOUNTER — Other Ambulatory Visit: Payer: Self-pay | Admitting: Neurological Surgery

## 2014-09-09 ENCOUNTER — Ambulatory Visit (INDEPENDENT_AMBULATORY_CARE_PROVIDER_SITE_OTHER): Payer: 59

## 2014-09-09 DIAGNOSIS — Z9889 Other specified postprocedural states: Secondary | ICD-10-CM

## 2014-09-09 DIAGNOSIS — S32009K Unspecified fracture of unspecified lumbar vertebra, subsequent encounter for fracture with nonunion: Secondary | ICD-10-CM

## 2014-09-09 DIAGNOSIS — M4326 Fusion of spine, lumbar region: Secondary | ICD-10-CM

## 2015-05-19 ENCOUNTER — Ambulatory Visit (INDEPENDENT_AMBULATORY_CARE_PROVIDER_SITE_OTHER): Payer: 59

## 2015-05-19 ENCOUNTER — Other Ambulatory Visit: Payer: Self-pay | Admitting: Neurological Surgery

## 2015-05-19 DIAGNOSIS — M47812 Spondylosis without myelopathy or radiculopathy, cervical region: Secondary | ICD-10-CM

## 2015-05-19 DIAGNOSIS — Z981 Arthrodesis status: Secondary | ICD-10-CM

## 2015-05-19 DIAGNOSIS — M545 Low back pain: Secondary | ICD-10-CM

## 2015-05-19 DIAGNOSIS — M4186 Other forms of scoliosis, lumbar region: Secondary | ICD-10-CM

## 2015-05-25 ENCOUNTER — Other Ambulatory Visit: Payer: Self-pay | Admitting: Neurological Surgery

## 2015-05-25 DIAGNOSIS — M545 Low back pain: Secondary | ICD-10-CM

## 2015-05-29 ENCOUNTER — Ambulatory Visit
Admission: RE | Admit: 2015-05-29 | Discharge: 2015-05-29 | Disposition: A | Discharge status: Home / Self Care | Payer: 59 | Source: Ambulatory Visit | Attending: Neurological Surgery | Admitting: Neurological Surgery

## 2015-05-29 DIAGNOSIS — M545 Low back pain: Secondary | ICD-10-CM

## 2015-12-26 IMAGING — CT CT L SPINE W/O CM
3 of 10 series · 9 of 33 positions shown, 11 images · non-contrast
Comparison: 05/19/2015 plain film examination of the lumbar spine.

CLINICAL DATA: 64-year-old female with chronic low back pain. Two
prior lumbar surgeries last July 2014. Bilateral leg pain with
bilateral foot numbness. No history of trauma or cancer. Subsequent
encounter.

EXAM:
CT LUMBAR SPINE WITHOUT CONTRAST
TECHNIQUE: Multidetector CT imaging of the lumbar spine was performed without
intravenous contrast administration. Multiplanar CT image
reconstructions were also generated.

[Series 3: l spine soft · axial · 0.29mm/px · z∈[-153,-153]mm · 1 of 83 slices shown, 2 images]
[im 50/83  soft-tissue]
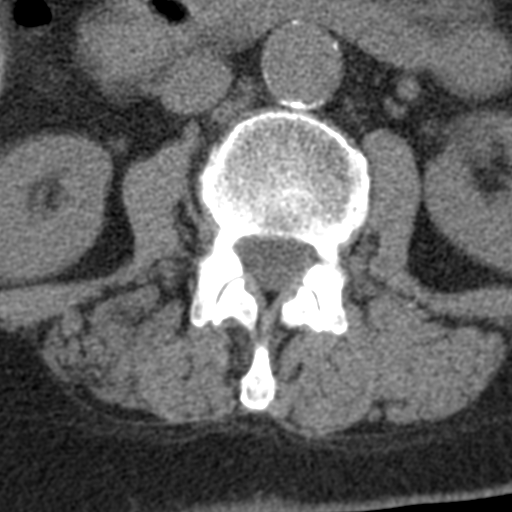
[im 50/83  bone]
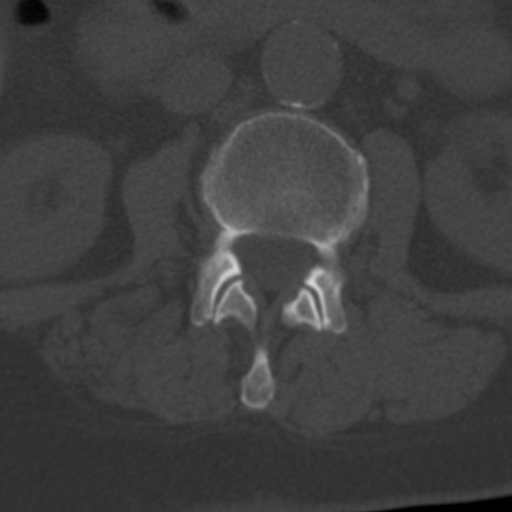

[Series 7: bone cor upper · coronal · 0.29mm/px · 3 of 38 slices shown]
[im 10/38  bone]
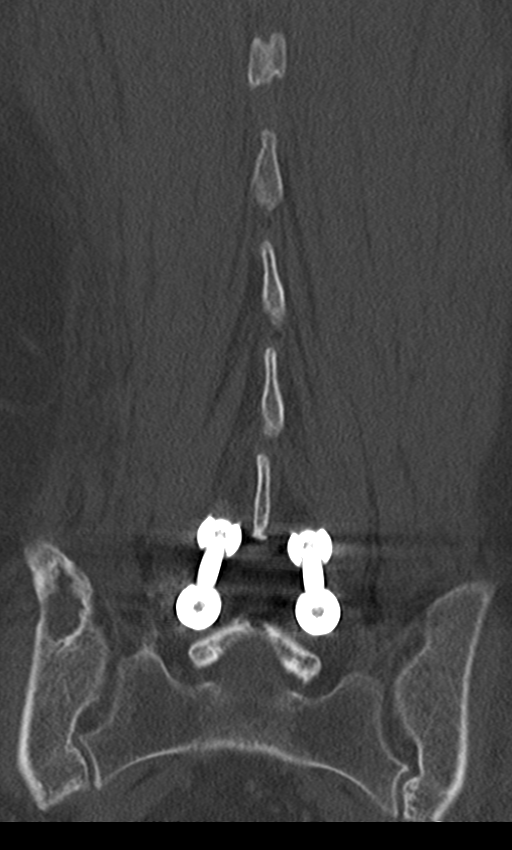
[im 19/38  bone]
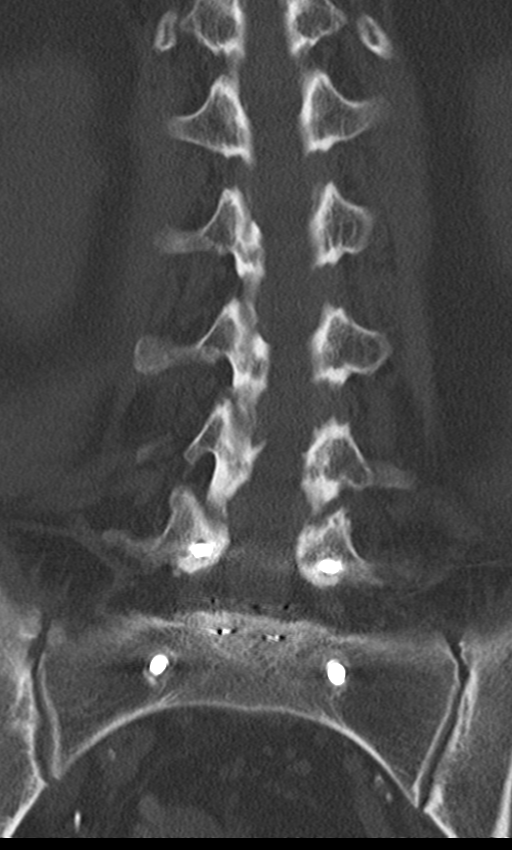
[im 28/38  bone]
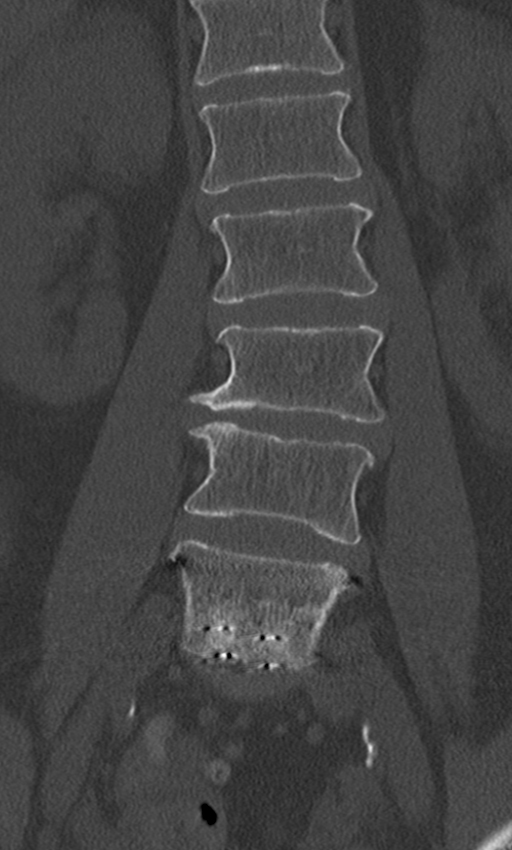

[Series 9: sag bone · sagittal · 0.30mm/px · 5 of 41 slices shown, 6 images]
[im 14/41  bone]
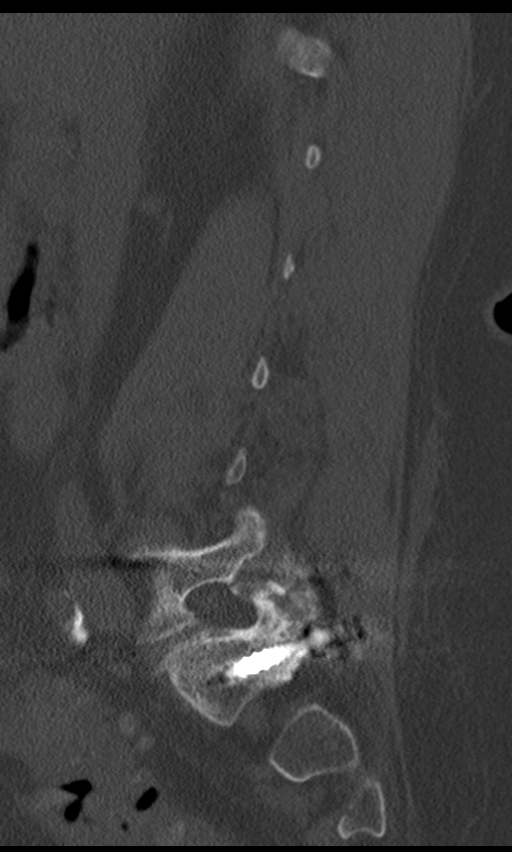
[im 17/41  bone]
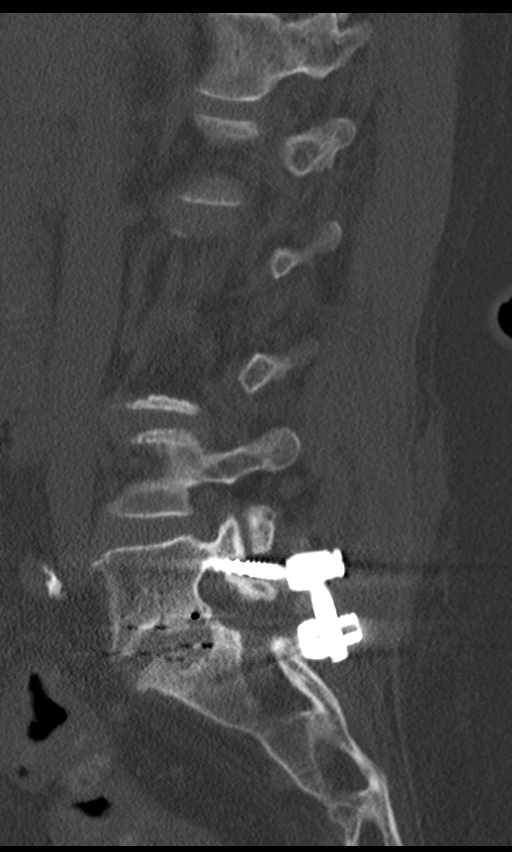
[im 21/41  soft-tissue]
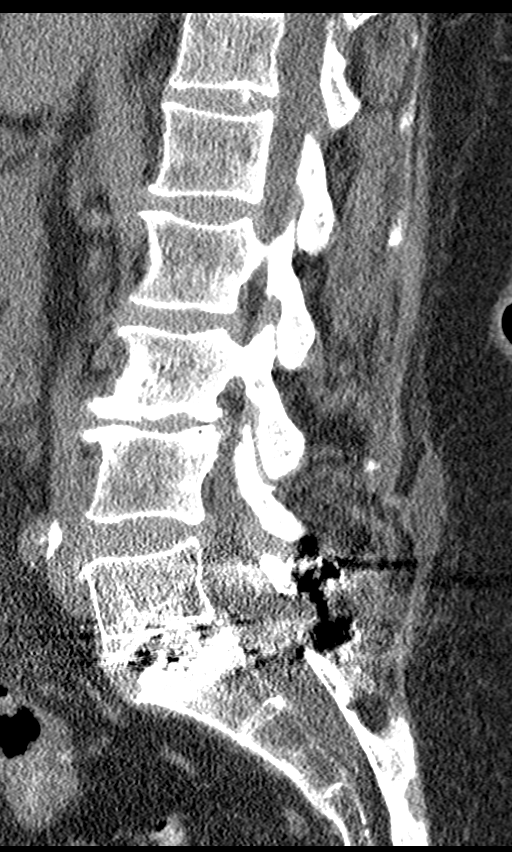
[im 21/41  bone]
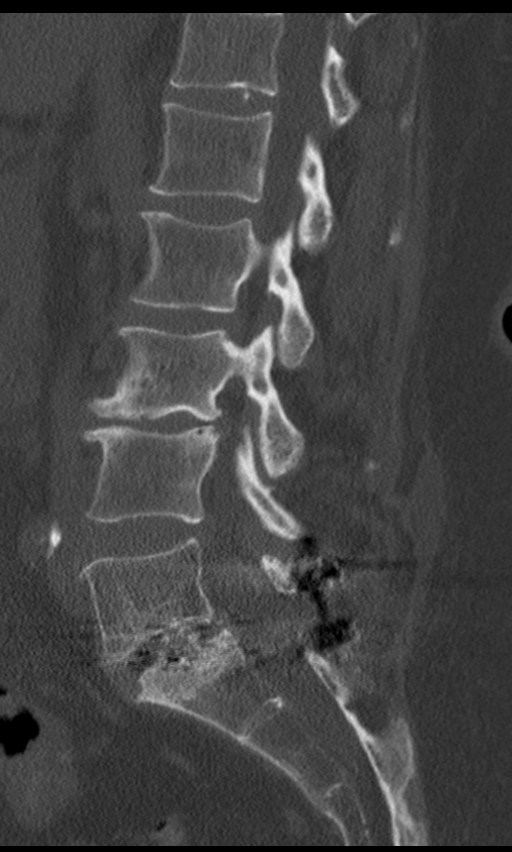
[im 24/41  bone]
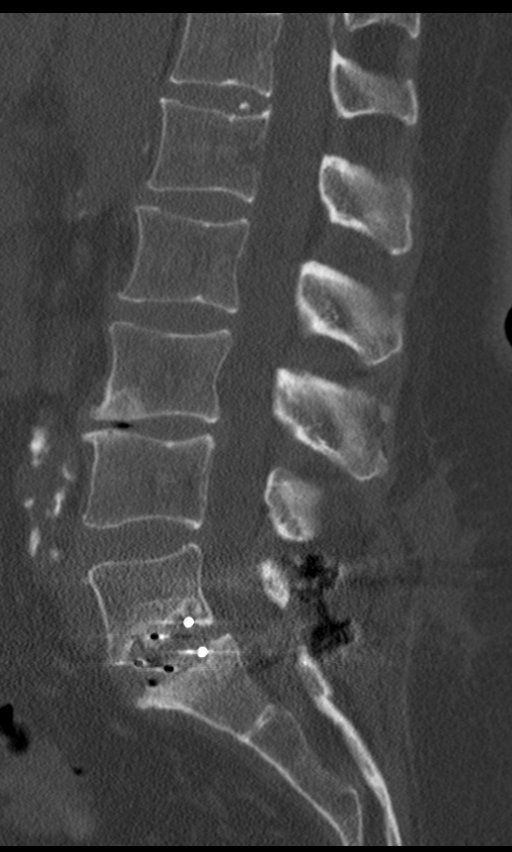
[im 27/41  bone]
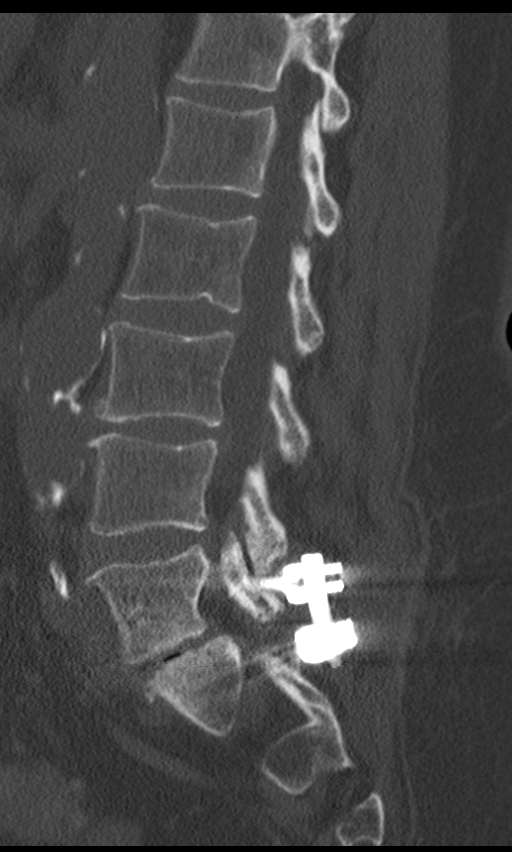

[9 of 33 positions shown; findings below may reference images not displayed]

05/08/2015 thoracic spine MR. Outside CT lumbar spine 06/10/2014.
Outside lumbar spine MR 06/22/2012.
FINDINGS: Last fully open disk space is labeled L5-S1. Present examination
incorporates from T12 through the mid lower sacrum.

Prominent atherosclerotic type changes of the abdominal aorta are
with infrarenal abdominal aortic focal bulge/aneurysm with maximal
transverse dimension 2.6 cm. Just above this level the aorta has a
maximal transverse dimension of 1.9 cm. Narrowing aortic bifurcation
and common iliac arteries. Findings incompletely assessed on present
exam. Patient would benefit from CT angiogram of the abdomen pelvis
to determine degree of stenosis.

Prominent sigmoid diverticula muscular hypertrophy. Haziness of fat
planes adjacent to the right aspect of the sigmoid colon may
represent adjacent ovary or scar from prior inflammation. If there
is any clinical suspicion of acute inflammation this would need to
be further assessed with dedicated abdominal and pelvic
contrast-enhanced CT.

Bilateral adrenal gland enlargement with low-density consistent with
adenomas this larger on the left measuring up to 1.8 cm.

Nonobstructing right renal 4 mm stone versus vascular
calcifications.

Scoliosis of the lumbar spine convex to the left. This is centered
at the L3-4 level.

T12-L1:  Negative.

L1-2:  Minimal bulge.

L2-3: Minimal to mild bulge. Mild facet joint degenerative changes.

L3-4: Disc degeneration with endplate reactive changes greater on
the right. Bulge and spur greater to the right with slight
encroachment upon but not compression of the exiting right L3 nerve
root. Facet joint degenerative changes. Foraminal narrowing greater
on the right. Mild to slightly moderate right-sided and mild
left-sided lateral recess stenosis. Mild spinal stenosis greater on
the right.

L4-5: Facet joint degenerative changes. Ligamentum flavum
hypertrophy. Bulge. Mild bilateral foraminal narrowing without nerve
root compression. Mild spinal stenosis and lateral recess stenosis
greater on left.

L5-S1: Prior laminectomy and fusion. Scar tissue and streak artifact
limit evaluation of the canal and thecal sac. No high-grade stenosis
of the neural foramen. Interbody spacer with subsiding into adjacent
endplates without osseous fusion across the disc space. No solid
bony fusion of posterior elements. Bilateral pedicle screws and
posterior connecting bar in place with suggestion of loosening of
the S1 pedicle screws bilaterally and left L5 pedicle screw.

Donor site right ilium.
IMPRESSION: Prominent sigmoid diverticula/ muscular hypertrophy. Haziness of fat
planes adjacent to the right aspect of the sigmoid colon may
represent adjacent ovary or scar from prior inflammation. If there
is any clinical suspicion of acute inflammation this would need to
be further assessed with dedicated abdominal and pelvic
contrast-enhanced CT.

Prominent atherosclerotic type changes of the abdominal aorta with
areas of narrowing as well as infrarenal bulge/small aneurysm.
Patient would benefit from CT angiogram of the abdomen pelvis to
determine degree of stenosis.

Bilateral adrenal gland enlargement with low-density consistent with
adenomas this larger on the left measuring up to 1.8 cm.

Scoliosis of the lumbar spine convex to the left.

L5-S1 prior laminectomy and fusion. Scar tissue and streak artifact
limit evaluation of the canal and thecal sac. No high-grade stenosis
of the neural foramen. Interbody spacer with subsiding into adjacent
endplates without osseous fusion across the disc space. No solid
bony fusion of posterior elements. Bilateral pedicle screws and
posterior connecting bar in place with suggestion of loosening of
the S1 pedicle screws bilaterally and left L5 pedicle screw.

L4-5 multifactorial mild bilateral foraminal narrowing without nerve
root compression. Mild spinal stenosis and lateral recess stenosis
greater on left.

L3-4 bulge and spur greater to the right with slight encroachment
upon but not compression of the exiting right L3 nerve root.
Multifactorial mild to slightly moderate right-sided and mild
left-sided lateral recess stenosis. Mild spinal stenosis greater on
the right.

Please see above for further detail.

These results will be called to the ordering clinician or
representative by the Radiologist Assistant, and communication
documented in the PACS or [employer] Dashboard.

## 2022-09-07 DEATH — deceased
